# Patient Record
Sex: Female | Born: 1960 | Race: Black or African American | Hispanic: No | Marital: Married | Smoking: Never smoker
Health system: Southern US, Community
[De-identification: ages and names within clinical notes are randomized; demographics above are authoritative.]

## PROBLEM LIST (undated history)

## (undated) DIAGNOSIS — I739 Peripheral vascular disease, unspecified: Secondary | ICD-10-CM

## (undated) DIAGNOSIS — I2510 Atherosclerotic heart disease of native coronary artery without angina pectoris: Secondary | ICD-10-CM

## (undated) DIAGNOSIS — I5022 Chronic systolic (congestive) heart failure: Secondary | ICD-10-CM

## (undated) DIAGNOSIS — E785 Hyperlipidemia, unspecified: Secondary | ICD-10-CM

## (undated) DIAGNOSIS — J449 Chronic obstructive pulmonary disease, unspecified: Secondary | ICD-10-CM

## (undated) DIAGNOSIS — I255 Ischemic cardiomyopathy: Secondary | ICD-10-CM

## (undated) DIAGNOSIS — I480 Paroxysmal atrial fibrillation: Secondary | ICD-10-CM

## (undated) DIAGNOSIS — K219 Gastro-esophageal reflux disease without esophagitis: Secondary | ICD-10-CM

## (undated) HISTORY — PX: ABDOMINAL AORTA STENT: SHX1108

## (undated) HISTORY — DX: Hyperlipidemia, unspecified: E78.5

## (undated) HISTORY — DX: Paroxysmal atrial fibrillation: I48.0

## (undated) HISTORY — DX: Chronic obstructive pulmonary disease, unspecified: J44.9

## (undated) HISTORY — DX: Peripheral vascular disease, unspecified: I73.9

## (undated) HISTORY — DX: Ischemic cardiomyopathy: I25.5

## (undated) HISTORY — DX: Chronic systolic (congestive) heart failure: I50.22

---

## 2011-08-29 DIAGNOSIS — J449 Chronic obstructive pulmonary disease, unspecified: Secondary | ICD-10-CM | POA: Insufficient documentation

## 2012-10-12 DIAGNOSIS — I70219 Atherosclerosis of native arteries of extremities with intermittent claudication, unspecified extremity: Secondary | ICD-10-CM | POA: Insufficient documentation

## 2013-07-08 ENCOUNTER — Ambulatory Visit (HOSPITAL_COMMUNITY): Admit: 2013-07-08 | Payer: BC Managed Care – PPO | Admitting: Cardiology

## 2013-07-08 ENCOUNTER — Inpatient Hospital Stay (HOSPITAL_COMMUNITY): Payer: BC Managed Care – PPO

## 2013-07-08 ENCOUNTER — Encounter (HOSPITAL_COMMUNITY): Payer: Self-pay | Admitting: Anesthesiology

## 2013-07-08 ENCOUNTER — Inpatient Hospital Stay (HOSPITAL_COMMUNITY)
Admission: EM | Admit: 2013-07-08 | Discharge: 2013-07-13 | DRG: 550 | Disposition: A | Discharge status: Home / Self Care | Payer: BC Managed Care – PPO | Attending: Cardiology | Admitting: Cardiology

## 2013-07-08 ENCOUNTER — Encounter (HOSPITAL_COMMUNITY): Payer: Self-pay | Admitting: *Deleted

## 2013-07-08 ENCOUNTER — Encounter (HOSPITAL_COMMUNITY)
Admission: EM | Disposition: A | Discharge status: Home / Self Care | Payer: Self-pay | Source: Home / Self Care | Attending: Cardiology

## 2013-07-08 DIAGNOSIS — I2582 Chronic total occlusion of coronary artery: Secondary | ICD-10-CM | POA: Diagnosis present

## 2013-07-08 DIAGNOSIS — Z880 Allergy status to penicillin: Secondary | ICD-10-CM

## 2013-07-08 DIAGNOSIS — I509 Heart failure, unspecified: Secondary | ICD-10-CM | POA: Diagnosis present

## 2013-07-08 DIAGNOSIS — I5021 Acute systolic (congestive) heart failure: Secondary | ICD-10-CM | POA: Diagnosis present

## 2013-07-08 DIAGNOSIS — I4949 Other premature depolarization: Secondary | ICD-10-CM | POA: Diagnosis not present

## 2013-07-08 DIAGNOSIS — I252 Old myocardial infarction: Secondary | ICD-10-CM

## 2013-07-08 DIAGNOSIS — Z8249 Family history of ischemic heart disease and other diseases of the circulatory system: Secondary | ICD-10-CM

## 2013-07-08 DIAGNOSIS — I4891 Unspecified atrial fibrillation: Secondary | ICD-10-CM | POA: Diagnosis not present

## 2013-07-08 DIAGNOSIS — I739 Peripheral vascular disease, unspecified: Secondary | ICD-10-CM | POA: Diagnosis present

## 2013-07-08 DIAGNOSIS — Z7982 Long term (current) use of aspirin: Secondary | ICD-10-CM

## 2013-07-08 DIAGNOSIS — Z6835 Body mass index (BMI) 35.0-35.9, adult: Secondary | ICD-10-CM

## 2013-07-08 DIAGNOSIS — I2109 ST elevation (STEMI) myocardial infarction involving other coronary artery of anterior wall: Principal | ICD-10-CM | POA: Diagnosis present

## 2013-07-08 DIAGNOSIS — I2589 Other forms of chronic ischemic heart disease: Secondary | ICD-10-CM | POA: Diagnosis present

## 2013-07-08 DIAGNOSIS — I255 Ischemic cardiomyopathy: Secondary | ICD-10-CM | POA: Diagnosis present

## 2013-07-08 DIAGNOSIS — I2510 Atherosclerotic heart disease of native coronary artery without angina pectoris: Secondary | ICD-10-CM

## 2013-07-08 DIAGNOSIS — R739 Hyperglycemia, unspecified: Secondary | ICD-10-CM | POA: Diagnosis present

## 2013-07-08 DIAGNOSIS — E785 Hyperlipidemia, unspecified: Secondary | ICD-10-CM | POA: Diagnosis present

## 2013-07-08 DIAGNOSIS — R7309 Other abnormal glucose: Secondary | ICD-10-CM | POA: Diagnosis present

## 2013-07-08 DIAGNOSIS — I219 Acute myocardial infarction, unspecified: Secondary | ICD-10-CM

## 2013-07-08 DIAGNOSIS — Z7902 Long term (current) use of antithrombotics/antiplatelets: Secondary | ICD-10-CM

## 2013-07-08 DIAGNOSIS — K219 Gastro-esophageal reflux disease without esophagitis: Secondary | ICD-10-CM | POA: Diagnosis present

## 2013-07-08 DIAGNOSIS — I213 ST elevation (STEMI) myocardial infarction of unspecified site: Secondary | ICD-10-CM

## 2013-07-08 DIAGNOSIS — Z79899 Other long term (current) drug therapy: Secondary | ICD-10-CM

## 2013-07-08 DIAGNOSIS — Z888 Allergy status to other drugs, medicaments and biological substances status: Secondary | ICD-10-CM

## 2013-07-08 HISTORY — PX: LEFT HEART CATHETERIZATION WITH CORONARY ANGIOGRAM: SHX5451

## 2013-07-08 HISTORY — DX: Gastro-esophageal reflux disease without esophagitis: K21.9

## 2013-07-08 HISTORY — PX: PERCUTANEOUS STENT INTERVENTION: SHX5500

## 2013-07-08 HISTORY — DX: Atherosclerotic heart disease of native coronary artery without angina pectoris: I25.10

## 2013-07-08 LAB — BASIC METABOLIC PANEL
BUN: 9 mg/dL (ref 6–23)
CO2: 21 mEq/L (ref 19–32)
Calcium: 8.6 mg/dL (ref 8.4–10.5)
Calcium: 8.7 mg/dL (ref 8.4–10.5)
Creatinine, Ser: 0.45 mg/dL — ABNORMAL LOW (ref 0.50–1.10)
GFR calc Af Amer: >90 mL/min (ref >90)
GFR calc non Af Amer: >90 mL/min (ref >90)
Glucose, Bld: 133 mg/dL — ABNORMAL HIGH (ref 70–99)
Glucose, Bld: 120 mg/dL — ABNORMAL HIGH (ref 70–99)
Sodium: 134 mEq/L — ABNORMAL LOW (ref 135–145)
Sodium: 138 mEq/L (ref 135–145)

## 2013-07-08 LAB — TROPONIN I
Troponin I: <0.3 ng/mL (ref <0.30)
Troponin I: >20 ng/mL (ref <0.30)

## 2013-07-08 LAB — CBC
MCH: 29.8 pg (ref 26.0–34.0)
MCHC: 32.9 g/dL (ref 30.0–36.0)
Platelets: 223 10*3/uL (ref 150–400)
RBC: 4.46 MIL/uL (ref 3.87–5.11)

## 2013-07-08 LAB — COMPREHENSIVE METABOLIC PANEL
Albumin: 3.5 g/dL (ref 3.5–5.2)
BUN: 11 mg/dL (ref 6–23)
Calcium: 9.3 mg/dL (ref 8.4–10.5)
Creatinine, Ser: 0.56 mg/dL (ref 0.50–1.10)
GFR calc Af Amer: >90 mL/min (ref >90)
Glucose, Bld: 117 mg/dL — ABNORMAL HIGH (ref 70–99)
Potassium: 3.8 mEq/L (ref 3.5–5.1)
Total Protein: 7.3 g/dL (ref 6.0–8.3)

## 2013-07-08 LAB — PROTIME-INR
INR: 1 (ref 0.00–1.49)
Prothrombin Time: 13 seconds (ref 11.6–15.2)

## 2013-07-08 LAB — CBC WITH DIFFERENTIAL/PLATELET
Basophils Absolute: 0 10*3/uL (ref 0.0–0.1)
Eosinophils Relative: 1 % (ref 0–5)
Lymphs Abs: 3.9 10*3/uL (ref 0.7–4.0)
MCH: 29.8 pg (ref 26.0–34.0)
MCV: 91.3 fL (ref 78.0–100.0)
Monocytes Absolute: 0.9 10*3/uL (ref 0.1–1.0)
Neutrophils Relative %: 51 % (ref 43–77)
Platelets: 254 10*3/uL (ref 150–400)
RBC: 4.59 MIL/uL (ref 3.87–5.11)
RDW: 13.9 % (ref 11.5–15.5)
WBC: 9.9 10*3/uL (ref 4.0–10.5)

## 2013-07-08 LAB — POCT I-STAT, CHEM 8
BUN: 11 mg/dL (ref 6–23)
Creatinine, Ser: 0.6 mg/dL (ref 0.50–1.10)
Glucose, Bld: 120 mg/dL — ABNORMAL HIGH (ref 70–99)
Hemoglobin: 15 g/dL (ref 12.0–15.0)
Potassium: 3.6 mEq/L (ref 3.5–5.1)
Sodium: 141 mEq/L (ref 135–145)
TCO2: 24 mmol/L (ref 0–100)

## 2013-07-08 LAB — HEMOGLOBIN A1C
Hgb A1c MFr Bld: 6.1 % — ABNORMAL HIGH (ref <5.7)
Mean Plasma Glucose: 128 mg/dL — ABNORMAL HIGH (ref <117)

## 2013-07-08 LAB — LIPID PANEL
HDL: 48 mg/dL (ref >39)
LDL Cholesterol: 105 mg/dL — ABNORMAL HIGH (ref 0–99)

## 2013-07-08 SURGERY — LEFT HEART CATHETERIZATION WITH CORONARY ANGIOGRAM
Anesthesia: Choice | Laterality: Bilateral

## 2013-07-08 MED ORDER — METOPROLOL TARTRATE 1 MG/ML IV SOLN: 2.5000 mg | INTRAVENOUS | Status: DC | PRN

## 2013-07-08 MED ORDER — ONDANSETRON HCL 4 MG/2ML IJ SOLN
INTRAMUSCULAR | Status: AC
  Administered 2013-07-08: 4 mg
  Filled 2013-07-08: qty 2

## 2013-07-08 MED ORDER — SODIUM CHLORIDE 0.9 % IV SOLN
INTRAVENOUS | Status: DC
  Administered 2013-07-08: 02:00:00 via INTRAVENOUS

## 2013-07-08 MED ORDER — NITROGLYCERIN IN D5W 200-5 MCG/ML-% IV SOLN
2.0000 ug/min | INTRAVENOUS | Status: DC
  Administered 2013-07-08: 5 ug/min via INTRAVENOUS
  Filled 2013-07-08: qty 250

## 2013-07-08 MED ORDER — PANTOPRAZOLE SODIUM 40 MG IV SOLR
40.0000 mg | INTRAVENOUS | Status: DC
  Administered 2013-07-08: 40 mg via INTRAVENOUS
  Filled 2013-07-08 (×2): qty 40

## 2013-07-08 MED ORDER — ATORVASTATIN CALCIUM 80 MG PO TABS
80.0000 mg | ORAL_TABLET | Freq: Every day | ORAL | Status: DC
  Administered 2013-07-08 – 2013-07-12 (×5): 80 mg via ORAL
  Filled 2013-07-08 (×6): qty 1

## 2013-07-08 MED ORDER — PROMETHAZINE HCL 25 MG/ML IJ SOLN: 12.5000 mg | Freq: Three times a day (TID) | INTRAMUSCULAR | Status: DC | PRN

## 2013-07-08 MED ORDER — MORPHINE SULFATE 2 MG/ML IJ SOLN
2.0000 mg | INTRAMUSCULAR | Status: DC | PRN
  Administered 2013-07-08 – 2013-07-12 (×2): 2 mg via INTRAVENOUS
  Filled 2013-07-08 (×2): qty 1

## 2013-07-08 MED ORDER — HEPARIN SODIUM (PORCINE) 5000 UNIT/ML IJ SOLN
5000.0000 [IU] | Freq: Three times a day (TID) | INTRAMUSCULAR | Status: DC
  Filled 2013-07-08 (×3): qty 1

## 2013-07-08 MED ORDER — ASPIRIN 81 MG PO CHEW
81.0000 mg | CHEWABLE_TABLET | Freq: Every day | ORAL | Status: DC
  Administered 2013-07-08 – 2013-07-13 (×6): 81 mg via ORAL
  Filled 2013-07-08 (×6): qty 1

## 2013-07-08 MED ORDER — AMIODARONE HCL 200 MG PO TABS
400.0000 mg | ORAL_TABLET | Freq: Two times a day (BID) | ORAL | Status: DC
  Administered 2013-07-08 – 2013-07-09 (×4): 400 mg via ORAL
  Filled 2013-07-08 (×6): qty 2

## 2013-07-08 MED ORDER — METOPROLOL TARTRATE 1 MG/ML IV SOLN
INTRAVENOUS | Status: AC
  Administered 2013-07-08: 5 mg
  Filled 2013-07-08: qty 5

## 2013-07-08 MED ORDER — SODIUM CHLORIDE 0.9 % IV SOLN
1.0000 mL/kg/h | INTRAVENOUS | Status: AC
  Administered 2013-07-08 (×2): 1 mL/kg/h via INTRAVENOUS

## 2013-07-08 MED ORDER — ONDANSETRON HCL 4 MG/2ML IJ SOLN
4.0000 mg | Freq: Four times a day (QID) | INTRAMUSCULAR | Status: DC | PRN
  Administered 2013-07-08: 4 mg via INTRAVENOUS
  Filled 2013-07-08: qty 2

## 2013-07-08 MED ORDER — HEPARIN SOD (PORK) LOCK FLUSH 100 UNIT/ML IV SOLN
INTRAVENOUS | Status: AC
  Filled 2013-07-08: qty 5

## 2013-07-08 MED ORDER — CARVEDILOL 3.125 MG PO TABS
3.1250 mg | ORAL_TABLET | Freq: Two times a day (BID) | ORAL | Status: DC
  Administered 2013-07-08: 3.125 mg via ORAL
  Filled 2013-07-08 (×3): qty 1

## 2013-07-08 MED ORDER — PRASUGREL HCL 10 MG PO TABS
10.0000 mg | ORAL_TABLET | Freq: Every day | ORAL | Status: DC
  Administered 2013-07-08 – 2013-07-13 (×6): 10 mg via ORAL
  Filled 2013-07-08 (×6): qty 1

## 2013-07-08 MED ORDER — HYDRALAZINE HCL 20 MG/ML IJ SOLN
10.0000 mg | INTRAMUSCULAR | Status: DC | PRN
  Administered 2013-07-08: 10 mg via INTRAVENOUS
  Filled 2013-07-08: qty 1

## 2013-07-08 MED ORDER — ACETAMINOPHEN 325 MG PO TABS
650.0000 mg | ORAL_TABLET | ORAL | Status: DC | PRN
  Administered 2013-07-09 – 2013-07-10 (×3): 650 mg via ORAL
  Filled 2013-07-08 (×3): qty 2

## 2013-07-08 MED ORDER — HEPARIN (PORCINE) IN NACL 100-0.45 UNIT/ML-% IJ SOLN
1200.0000 [IU]/h | INTRAMUSCULAR | Status: DC
  Administered 2013-07-08: 900 [IU]/h via INTRAVENOUS
  Administered 2013-07-09 – 2013-07-11 (×3): 1050 [IU]/h via INTRAVENOUS
  Administered 2013-07-12: 1200 [IU]/h via INTRAVENOUS
  Filled 2013-07-08 (×9): qty 250

## 2013-07-08 MED ORDER — HEPARIN SODIUM (PORCINE) 5000 UNIT/ML IJ SOLN
4000.0000 [IU] | Freq: Once | INTRAMUSCULAR | Status: AC
  Administered 2013-07-08: 4000 [IU] via INTRAVENOUS
  Filled 2013-07-08: qty 0.8

## 2013-07-08 MED ORDER — MORPHINE SULFATE 4 MG/ML IJ SOLN
INTRAMUSCULAR | Status: AC
  Administered 2013-07-08: 4 mg
  Filled 2013-07-08: qty 1

## 2013-07-08 MED ORDER — CARVEDILOL 6.25 MG PO TABS
6.2500 mg | ORAL_TABLET | Freq: Two times a day (BID) | ORAL | Status: DC
  Administered 2013-07-08 – 2013-07-10 (×5): 6.25 mg via ORAL
  Filled 2013-07-08 (×12): qty 1

## 2013-07-08 MED ORDER — HEPARIN (PORCINE) IN NACL 100-0.45 UNIT/ML-% IJ SOLN
12.0000 [IU]/kg/h | INTRAMUSCULAR | Status: DC
Start: 2013-07-08 — End: 2013-07-08
  Administered 2013-07-08: 12 [IU]/kg/h via INTRAVENOUS
  Filled 2013-07-08: qty 250

## 2013-07-08 MED ORDER — METOPROLOL TARTRATE 1 MG/ML IV SOLN: 5.0000 mg | INTRAVENOUS | Status: DC | PRN

## 2013-07-08 MED ORDER — NITROGLYCERIN 0.4 MG SL SUBL
0.4000 mg | SUBLINGUAL_TABLET | SUBLINGUAL | Status: DC | PRN
  Administered 2013-07-08 (×3): 0.4 mg via SUBLINGUAL
  Filled 2013-07-08: qty 25

## 2013-07-08 MED ORDER — ASPIRIN 325 MG PO TABS
325.0000 mg | ORAL_TABLET | Freq: Once | ORAL | Status: AC
  Administered 2013-07-08: 325 mg via ORAL
  Filled 2013-07-08: qty 1

## 2013-07-08 MED ORDER — SODIUM CHLORIDE 0.9 % IV SOLN
0.2500 mg/kg/h | INTRAVENOUS | Status: AC
  Administered 2013-07-08: 0.25 mg/kg/h via INTRAVENOUS

## 2013-07-08 NOTE — Progress Notes (Signed)
*  PRELIMINARY RESULTS* Echocardiogram 2D Echocardiogram has been performed.  Traci Choi 07/08/2013, 10:58 AM

## 2013-07-08 NOTE — CV Procedure (Signed)
Cardiac Catheterization Procedure Note  Name: Traci Choi MRN: [id number] DOB: 14-Jan-1961  Procedure: Left Heart Cath, Selective Coronary Angiography, LV angiography, PTCA and stenting of the proximal LAD  Indication: 52 year-old female with previous history of aortic stenting presented with acute onset of chest pain tonight. Initial EKGs did not show diagnostic ST changes. Pain waxed and waned in intensity. With worsening chest pain repeat ECG demonstrated ST elevation in the anterior precordial leads consistent with an anterior STEMI. There was delay in diagnosis because of initial nondiagnostic ECG changes.  Procedural Details:  The right wrist was prepped, draped, and anesthetized with 1% lidocaine. Using the modified Seldinger technique, a 6 French sheath was introduced into the right radial artery. 3 mg of verapamil was administered through the sheath, weight-based unfractionated heparin was administered intravenously. Standard Judkins catheters were used for selective coronary angiography and left ventriculography. Catheter exchanges were performed over an exchange length guidewire.  PROCEDURAL FINDINGS Hemodynamics: AO 99/72 with a mean of 85 mmHg LV 101/19 mmHg   Coronary angiography: Coronary dominance: Left  Left mainstem: The left main coronary was short and without significant disease.  Left anterior descending (LAD): The left anterior descending artery was occluded proximally with TIMI grade 0 flow.  Left circumflex (LCx): The left circumflex was a dominant vessel. It gives rise to 2 marginal branches in the proximal and mid vessel. It then terminates in a small posterior lateral branch and the PDA. The left circumflex proper has no significant disease. The first obtuse marginal vessel as 40% narrowing proximally. The second marginal branch is relatively small with diffuse 70-80% disease proximally. There is also a segmental 70-80% stenosis in the proximal PDA.  Right  coronary artery (RCA): The right coronary is a small nondominant vessel and is normal.  Left ventriculography: Left ventricular size is normal. There is severe hypokinesis involving the mid to distal anterior wall. There is akinesis of the apex and distal inferior wall. Ejection fraction is estimated at 35-40%.  PCI Note:  Following the diagnostic procedure, the decision was made to proceed with PCI. The radial sheath was upsized to a 6 [place]. Weight-based bivalirudin was given for anticoagulation. Effient 60 mg was given orally.Once a therapeutic ACT was achieved, a 6 [place] XB LAD 3.5 guide catheter was inserted.  A pro-water coronary guidewire was used to cross the lesion.  The lesion was predilated with a 2.5 mm balloon. With reperfusion the patient did experience some PVCs and brief runs of idioventricular rhythm. The lesion was then stented with a 3.0 x 20 mm Promus premier stent.   Following PCI, there was 0% residual stenosis and TIMI-3 flow. Final angiography confirmed an excellent result. The patient was pain free at the end of the procedure. The patient tolerated the procedure well. There were no immediate procedural complications. A TR band was used for radial hemostasis. The patient was transferred to the post catheterization recovery area for further monitoring.  PCI Data: Vessel - LAD/Segment - proximal Percent Stenosis (pre)  100% TIMI-flow 0 Stent 3.0 x 20 mm Promus premier Percent Stenosis (post) 0% TIMI-flow (post) 3  Final Conclusions:   1. 2 vessel obstructive coronary disease. The culprit is the occlusion in the proximal LAD. 2. Moderate to severe left ventricular dysfunction 3. Successful stenting of the proximal LAD with a drug-eluting stent.   Recommendations:  We will continue IV Angiomax at a reduced dose for a few hours. Continue dual antiplatelet therapy for one year. Risk factor modification. Initiate beta  blocker and ACE inhibitor therapy as tolerated.  Theron Arista  [facility] 07/08/2013, 2:51 AM

## 2013-07-08 NOTE — ED Provider Notes (Signed)
CSN: [id number]     Arrival date & time 07/08/13  0111 History   None    Chief Complaint  Patient presents with  . Chest Pain    HPI Patient presents with waxing and waning chest discomfort that radiates up into her left jaw.  She's currently with her husband who is a patient in the hospital.  Her pain is worsened over the past hour.  She was having some "indigestion" symptoms through the latter part of this evening.  She developed chest discomfort when walking out to her car.  It resolved with rest.  She then came to the emergency department developed chest pain when walking back in to the emergency department.  Initially it sounds like the patient had a concerning ST segments on the monitor by nursing however by that time it will be was obtained the patient's pain was significantly improved and she had only nonspecific ST changes anteriorly.  No old EKG was available.  She denies a history of hypertension, hypercholesterol, diabetes.  She does not smoke cigarettes.  She states that her mother did have a heart attack in her late 4s.  She's never had a heart catheterization.  No known history of coronary disease.   Past Medical History  Diagnosis Date  . GERD (gastroesophageal reflux disease)   . Coronary artery disease    Past Surgical History  Procedure Laterality Date  . Abdominal aorta stent     No family history on file. History  Substance Use Topics  . Smoking status: Never Smoker   . Smokeless tobacco: Not on file  . Alcohol Use: No   OB History   Grav Para Term Preterm Abortions TAB SAB Ect Mult Living                 Review of Systems  All other systems reviewed and are negative.    Allergies  Flagyl and Penicillins  Home Medications  No current outpatient prescriptions on file. Ht 5' (1.524 m)  Wt 180 lb (81.647 kg)  BMI 35.15 kg/m2 Physical Exam  Nursing note and vitals reviewed. Constitutional: She is oriented to person, place, and time. She appears  well-developed and well-nourished. No distress.  HENT:  Head: Normocephalic and atraumatic.  Eyes: EOM are normal.  Neck: Normal range of motion.  Cardiovascular: Normal rate, regular rhythm and normal heart sounds.   Pulmonary/Chest: Effort normal and breath sounds normal.  Abdominal: Soft. She exhibits no distension. There is no tenderness.  Musculoskeletal: Normal range of motion.  Neurological: She is alert and oriented to person, place, and time.  Skin: Skin is warm and dry.  Psychiatric: She has a normal mood and affect. Judgment normal.    ED Course  Procedures (including critical care time)   Date: 07/08/2013  0114  Rate: 52  Rhythm: normal sinus rhythm  QRS Axis: normal  Intervals: normal  ST/T Wave abnormalities: Nonspecific anterior ST changes  Narrative Interpretation:   Old EKG Reviewed: No prior EKG available    Date: 07/08/2013  0131  Rate: 89  Rhythm: normal sinus rhythm  QRS Axis: normal  Intervals: normal  ST/T Wave abnormalities: Anterior ST elevation  Conduction Disutrbances: none  Narrative Interpretation:   Old EKG Reviewed: Change from prior EKG, now with anterior ST elevation consistent with STEMI  CRITICAL CARE Performed by: Lyanne Co Total critical care time: 30 Critical care time was exclusive of separately billable procedures and treating other patients. Critical care was necessary to treat or prevent  imminent or life-threatening deterioration. Critical care was time spent personally by me on the following activities: development of treatment plan with patient and/or surrogate as well as nursing, discussions with consultants, evaluation of patient's response to treatment, examination of patient, obtaining history from patient or surrogate, ordering and performing treatments and interventions, ordering and review of laboratory studies, ordering and review of radiographic studies, pulse oximetry and re-evaluation of patient's  condition.     Labs Review Labs Reviewed  CBC WITH DIFFERENTIAL  BASIC METABOLIC PANEL  TROPONIN I  APTT  CBC  COMPREHENSIVE METABOLIC PANEL  PROTIME-INR  POCT I-STAT TROPONIN I   Imaging Review No results found.  MDM   1. STEMI (ST elevation myocardial infarction)    Patient presented with initial equivocal EKG.  Serial EKGs were obtained which ultimately demonstrated anterior ST elevation.  Her pain is definitely waxing and waning at this time.  Some of this could represent coronary spasm versus ruptured plaque with intermittent obstruction.  Patient be transferred emergently to [facility] for definitive diagnosis and likely PCI.  I discussed the case with interventional cardiologist Dr. Peter Swaziland who accepts the patient in transfer.  The patient be transferred emergently.  She's been given aspirin 325 mg.  She's been given a heparin bolus of 4000 units without drip.  Currently her heart rate is 79.  No beta blocker will be given at this time.    Lyanne Co, MD 07/08/13 [phone]

## 2013-07-08 NOTE — Care Management Note (Addendum)
    Page 1 of 1   07/13/2013     5:27:54 PM   CARE MANAGEMENT NOTE 07/13/2013  Patient:  Traci Choi, Traci Choi   Account Number:  [id number]  Date Initiated:  07/08/2013  Documentation initiated by:  Junius Creamer  Subjective/Objective Assessment:   adm w mi     Action/Plan:   lives w husband   Anticipated DC Date:  07/13/2013   Anticipated DC Plan:  HOME/SELF CARE      DC Planning Services  CM consult  Medication Assistance      Choice offered to / List presented to:             Status of service:  Completed, signed off Medicare Important Message given?   (If response is "NO", the following Medicare IM given date fields will be blank) Date Medicare IM given:   Date Additional Medicare IM given:    Discharge Disposition:  HOME/SELF CARE  Per UR Regulation:  Reviewed for med. necessity/level of care/duration of stay  If discussed at Long Length of Stay Meetings, dates discussed:    Comments:  07/13/13 Javone Ybanez,RN,BSN [phone] PT ORIGINALLY THOUGHT TO HAVE NO INSURANCE; SHE ACTUALLY HAS BCBS INSURANCE.  SHE HAS UPDATED HER INFORMATION WITH ADMITTING.  EXPLAINED EFFIENT FREE 30DAY TRIAL CARD AND COPAY ASSISTANCE CARD TO PT.  NO NEED FOR DRUG CO. ASSISTANCE PROGRAM.  8/29 0922 debie dowell rn,bsn left pt effient 30day free card and copay assist cards. left 2 prescription discount cards. pt from [facility] no ins liested. left pt assist form on shadow chart for effient for md to sign.
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
## 2013-07-08 NOTE — ED Notes (Signed)
Notified EDP, Patria Mane, MD pt. I-stat troponin result 0.05.

## 2013-07-08 NOTE — Progress Notes (Signed)
Ward Givens NP notified of pt's B/P and HR. Orders received

## 2013-07-08 NOTE — Progress Notes (Signed)
Patient is now chest pain free. She feels breathless. Oxygen levels are good. Lungs clear on exam. Will check portable CXR.  Peter Sw[place]and MD, [facility]  5:20 PM

## 2013-07-08 NOTE — Progress Notes (Signed)
CRITICAL VALUE ALERT  Critical value received: Trop > 20  Date of notification:  06/2913  Time of notification:  N/A  Critical value read back:N/A  Nurse who received alert:  P. Mikle Bosworth RN  MD notified (1st page):  Expected value  Time of first page:  N/A  MD notified (2nd page):  Time of second page:  Responding MD:  N/A  Time MD responded:  N/A

## 2013-07-08 NOTE — H&P (Signed)
Cardiology History and Physical  No primary provider on file.  History of Present Illness (and review of medical records): Traci Choi is a 52 y.o. female who presents for evaluation of chest pain.  She has no prior hx of MI.  Denies HTN, DM, or tobacco history.  She presents with waxing and waning chest discomfort that radiates up into her left jaw. She's currently with her husband who is a patient in the hospital. Her pain is worsened over the past hour. She was having some "indigestion" symptoms through the latter part of this evening. She developed chest discomfort when walking out to her car. It resolved with rest. She then came to the emergency department developed chest pain when walking back in to the emergency department. She had ekg changes concerning during evaluation and Code Stemi was activated.  She was transferred to [facility] for further management.  Previous diagnostic testing for coronary artery disease includes: none. Previous history of cardiac disease includes Possible AAA repari. Coronary artery disease risk factors include: obesity (BMI >= 30 kg/m2). Patient denies history of previous M.I..  Review of Systems Further review of systems was otherwise negative other than stated in HPI.  There are no active problems to display for this patient.  Past Medical History  Diagnosis Date  . GERD (gastroesophageal reflux disease)   . Coronary artery disease     Past Surgical History  Procedure Laterality Date  . Abdominal aorta stent       (Not in a hospital admission) Allergies  Allergen Reactions  . Flagyl [Metronidazole]   . Penicillins     History  Substance Use Topics  . Smoking status: Never Smoker   . Smokeless tobacco: Not on file  . Alcohol Use: No    No family history on file.   Objective:  Patient Vitals for the past 8 hrs:  Height Weight  07/08/13 0137 5' (1.524 m) 81.647 kg (180 lb)   General appearance: alert, cooperative, appears stated age and  moderate distress, obese Head: Normocephalic, without obvious abnormality, atraumatic Eyes: conjunctivae/corneas clear. PERRL, EOM's intact. Fundi benign. Neck: no carotid bruit, no JVD and supple, symmetrical, trachea midline Lungs: clear to auscultation bilaterally Chest wall: no tenderness Heart: regular rate and rhythm, S1, S2 normal, no murmur, click, rub or gallop Abdomen: soft, non-tender; bowel sounds normal; no masses,  no organomegaly Extremities: extremities normal, atraumatic, no cyanosis or edema Pulses: 2+ and symmetric Neurologic: Grossly normal  Results for orders placed during the hospital encounter of 07/08/13 (from the past 48 hour(s))  CBC WITH DIFFERENTIAL     Status: None   Collection Time    07/08/13  1:28 AM      Result Value Range   WBC 9.9  4.0 - 10.5 K/uL   RBC 4.59  3.87 - 5.11 MIL/uL   Hemoglobin 13.7  12.0 - 15.0 g/dL   HCT 21.3  08.6 - 57.8 %   MCV 91.3  78.0 - 100.0 fL   MCH 29.8  26.0 - 34.0 pg   MCHC 32.7  30.0 - 36.0 g/dL   RDW 46.9  62.9 - 52.8 %   Platelets 254  150 - 400 K/uL   Neutrophils Relative % 51  43 - 77 %   Lymphocytes Relative 39  12 - 46 %   Monocytes Relative 9  3 - 12 %   Eosinophils Relative 1  0 - 5 %   Basophils Relative 0  0 - 1 %   Neutro  Abs 5.0  1.7 - 7.7 K/uL   Lymphs Abs 3.9  0.7 - 4.0 K/uL   Monocytes Absolute 0.9  0.1 - 1.0 K/uL   Eosinophils Absolute 0.1  0.0 - 0.7 K/uL   Basophils Absolute 0.0  0.0 - 0.1 K/uL   Smear Review MORPHOLOGY UNREMARKABLE    TROPONIN I     Status: None   Collection Time    07/08/13  1:28 AM      Result Value Range   Troponin I <0.30  <0.30 ng/mL   Comment:            Due to the release kinetics of cTnI,     a negative result within the first hours     of the onset of symptoms does not rule out     myocardial infarction with certainty.     If myocardial infarction is still suspected,     repeat the test at appropriate intervals.  COMPREHENSIVE METABOLIC PANEL     Status: Abnormal    Collection Time    07/08/13  1:28 AM      Result Value Range   Sodium 139  135 - 145 mEq/L   Potassium 3.8  3.5 - 5.1 mEq/L   Chloride 105  96 - 112 mEq/L   CO2 25  19 - 32 mEq/L   Glucose, Bld 117 (*) 70 - 99 mg/dL   BUN 11  6 - 23 mg/dL   Creatinine, Ser 9.60  0.50 - 1.10 mg/dL   Calcium 9.3  8.4 - 45.4 mg/dL   Total Protein 7.3  6.0 - 8.3 g/dL   Albumin 3.5  3.5 - 5.2 g/dL   AST 14  0 - 37 U/L   ALT 13  0 - 35 U/L   Alkaline Phosphatase 76  39 - 117 U/L   Total Bilirubin 0.2 (*) 0.3 - 1.2 mg/dL   GFR calc non Af Amer >90  >90 mL/min   GFR calc Af Amer >90  >90 mL/min   Comment: (NOTE)     The eGFR has been calculated using the CKD EPI equation.     This calculation has not been validated in all clinical situations.     eGFR's persistently <90 mL/min signify possible Chronic Kidney     Disease.  POCT I-STAT TROPONIN I     Status: None   Collection Time    07/08/13  1:37 AM      Result Value Range   Troponin i, poc 0.05  0.00 - 0.08 ng/mL   Comment 3            Comment: Due to the release kinetics of cTnI,     a negative result within the first hours     of the onset of symptoms does not rule out     myocardial infarction with certainty.     If myocardial infarction is still suspected,     repeat the test at appropriate intervals.  POCT I-STAT, CHEM 8     Status: Abnormal   Collection Time    07/08/13  1:50 AM      Result Value Range   Sodium 141  135 - 145 mEq/L   Potassium 3.6  3.5 - 5.1 mEq/L   Chloride 105  96 - 112 mEq/L   BUN 11  6 - 23 mg/dL   Creatinine, Ser 0.98  0.50 - 1.10 mg/dL   Glucose, Bld 119 (*) 70 - 99 mg/dL   Calcium,  Ion 1.22  1.12 - 1.23 mmol/L   TCO2 24  0 - 100 mmol/L   Hemoglobin 15.0  12.0 - 15.0 g/dL   HCT 16.1  09.6 - 04.5 %   No results found.  ECG:  Initial ecgs with 1mm ST elevation in anterior leads around 1:14am, this progressive to 2-83mm ST elevation around 1:43  Assessment: Acute anteroseptal STEMI  Plan:  1. Will  proceed with urgent cardiac catheterization with possible PCI by Dr. Swaziland 2. Cardiology Admission to ICU 3. Continuous monitoring on Telemetry. 4. Repeat ekg on admit, prn chest pain or arrythmia 5. Trend cardiac biomarkers, check lipids, hgba1c, tsh 6. Medical management to include DAPT, BB, Statin, NTG prn 7. Further plan pending clinical course and outcome of Cath 8. Cardiac education and rehab.

## 2013-07-08 NOTE — Progress Notes (Signed)
MD informed about new onset afiib with rates ranging from 80s to 110 and patient is hemodynamically stable. Orders given to give coreg early

## 2013-07-08 NOTE — Progress Notes (Deleted)
Hypoglycemic Event TIME 0406  CBG: 69  Treatment: 25ml d 50  Symptoms: sedated/unable to assess  Follow-up CBG: [id number] CBG Result:73  Possible Reasons for Event: npo  Comments/MD notified no    Amo Kuffour, Alinda Money  Remember to initiate Hypoglycemia Order Set & complete

## 2013-07-08 NOTE — Progress Notes (Signed)
Came to begin ed process with pt however she is too sleepy and feeling poorly to discuss. Left MI book and stent card with sister. Will f/u tomorrow. Pt does st she is having a nagging feeling in her chest. HR 128 Afib. Ethelda Chick CES, ACSM 2:13 PM 07/08/2013

## 2013-07-08 NOTE — Progress Notes (Signed)
Dr. Swaziland notified of pt;s rhythm atrial fib, and lab results,  DrMarland Kitchen Swaziland in to see pt. Orders received

## 2013-07-08 NOTE — Progress Notes (Signed)
ANTICOAGULATION CONSULT NOTE - Follow Up Consult  Pharmacy Consult for Heparin Indication: atrial fibrillation  Allergies  Allergen Reactions  . Flagyl [Metronidazole] Other (See Comments)    unknown  . Penicillins Nausea And Vomiting and Rash    Patient Measurements: Height: 5' (152.4 cm) Weight: 180 lb 5.4 oz (81.8 kg) IBW/kg (Calculated) : 45.5 Heparin Dosing Weight: 65 kg  Vital Signs: Temp: 98.6 F (37 C) (08/29 1943) Temp src: Oral (08/29 1943) BP: 109/73 mmHg (08/29 1900)  Labs:  Recent Labs  07/08/13 0128 07/08/13 0150 07/08/13 0430 07/08/13 1025 07/08/13 1600 07/08/13 2018  HGB 13.7 15.0 13.3  --   --   --   HCT 41.9 44.0 40.4  --   --   --   PLT 254  --  223  --   --   --   APTT 25  --   --   --   --   --   LABPROT 13.0  --   --   --   --   --   INR 1.00  --   --   --   --   --   HEPARINUNFRC  --   --   --   --   --  0.26*  CREATININE 0.56 0.60 0.43* 0.45*  --   --   TROPONINI <0.30  --   --  >20.00* >20.00*  --     Estimated Creatinine Clearance: 76.1 ml/min (by C-G formula based on Cr of 0.45).   Medications:  Infusions:  . heparin 900 Units/hr (07/08/13 1400)  . nitroGLYCERIN 5 mcg/min (07/08/13 1722)    Assessment: 52 year old female receiving anticoagulation with Heparin for atrial fibrillation.  Her initial heparin level is slightly subtherapeutic.  Goal of Therapy:  Heparin level 0.3-0.7 units/ml Monitor platelets by anticoagulation protocol: Yes   Plan:  Increase Heparin to 1050 units/hr Check Heparin level and CBC with AM labs  Estella Husk, Pharm.D., BCPS, AAHIVP Clinical Pharmacist Phone: [phone] or [phone] 07/08/2013, 9:21 PM

## 2013-07-08 NOTE — Progress Notes (Signed)
ANTICOAGULATION CONSULT NOTE - Initial Consult  Pharmacy Consult for heparin Indication:  afib  Allergies  Allergen Reactions  . Flagyl [Metronidazole] Other (See Comments)    unknown  . Penicillins Nausea And Vomiting and Rash    Patient Measurements: Height: 5' (152.4 cm) Weight: 180 lb 5.4 oz (81.8 kg) IBW/kg (Calculated) : 45.5 Heparin Dosing Weight: 64.5kg  Vital Signs: Temp: 97.5 F (36.4 C) (08/29 0722) Temp src: Oral (08/29 0722) BP: 125/98 mmHg (08/29 0900) Pulse Rate: 85 (08/29 0722)  Labs:  Recent Labs  07/08/13 0128 07/08/13 0150 07/08/13 0430  HGB 13.7 15.0 13.3  HCT 41.9 44.0 40.4  PLT 254  --  223  APTT 25  --   --   LABPROT 13.0  --   --   INR 1.00  --   --   CREATININE 0.56 0.60 0.43*  TROPONINI <0.30  --   --     Estimated Creatinine Clearance: 76.1 ml/min (by C-G formula based on Cr of 0.43).   Medical History: Past Medical History  Diagnosis Date  . GERD (gastroesophageal reflux disease)   . Coronary artery disease     Medications:  Scheduled:  . amiodarone  400 mg Oral BID  . aspirin  81 mg Oral Daily  . atorvastatin  80 mg Oral q1800  . carvedilol  3.125 mg Oral BID WC  . prasugrel  10 mg Oral Daily   Infusions:  . sodium chloride 1 mL/kg/hr (07/08/13 4098)    Assessment: 52 yo female here with CP and s/p cath noted with afib. Patient to begin heparin 6 hours after radial band removal (done at 7am today)  Goal of Therapy:  Heparin level 0.3-0.7 units/ml Monitor platelets by anticoagulation protocol: Yes   Plan:  -No heparin bolus -Begin heparin at 900 units/hr (~14 units/kg/hr) at 1pm -Heparin level in 6 hours and daily wth CBC daily  Harland German, Pharm D 07/08/2013 9:44 AM

## 2013-07-08 NOTE — Progress Notes (Signed)
TELEMETRY: Reviewed telemetry pt in atrial fibrillation with controlled ventricular response.: Filed Vitals:   07/08/13 0800 07/08/13 0900 07/08/13 1000 07/08/13 1100  BP: 106/76 125/98 133/95 151/127  Pulse:      Temp:      TempSrc:      Resp: 15 15 13 14   Height:      Weight:      SpO2: 100% 100% 100% 100%    Intake/Output Summary (Last 24 hours) at 07/08/13 1141 Last data filed at 07/08/13 1100  Gross per 24 hour  Intake 677.49 ml  Output    150 ml  Net 527.49 ml    SUBJECTIVE: Patient had some mild chest pressure at 5 am. Otherwise chest pain resolved. No SOB. Now in atrial fibrillation.  LABS: Basic Metabolic Panel:  Recent Labs  16/10/96 0430 07/08/13 1025  NA 134* 138  K 6.0* 4.4  CL 103 105  CO2 22 21  GLUCOSE 133* 120*  BUN 9 8  CREATININE 0.43* 0.45*  CALCIUM 8.6 8.7   Liver Function Tests:  Recent Labs  07/08/13 0128  AST 14  ALT 13  ALKPHOS 76  BILITOT 0.2*  PROT 7.3  ALBUMIN 3.5   No results found for this basename: LIPASE, AMYLASE,  in the last 72 hours CBC:  Recent Labs  07/08/13 0128 07/08/13 0150 07/08/13 0430  WBC 9.9  --  11.8*  NEUTROABS 5.0  --   --   HGB 13.7 15.0 13.3  HCT 41.9 44.0 40.4  MCV 91.3  --  90.6  PLT 254  --  223   Cardiac Enzymes:  Recent Labs  07/08/13 0128  TROPONINI <0.30   Fasting Lipid Panel:  Recent Labs  07/08/13 0430  CHOL 208*  HDL 48  LDLCALC 105*  TRIG 277*  CHOLHDL 4.3   Thyroid Function Tests:  Recent Labs  07/08/13 0430  TSH 1.545   Radiology/Studies:  No results found.  Ecg: atrial fibrillation with rate 94 bpm. Anteroseptal MI. ST elevation resolved.  PHYSICAL EXAM General: Well developed, well nourished, in no acute distress. Head: Normocephalic, atraumatic, sclera non-icteric, no xanthomas, nares are without discharge. Neck: Negative for carotid bruits. JVD not elevated. Lungs: Clear bilaterally to auscultation without wheezes, rales, or rhonchi. Breathing is  unlabored. Heart: IRRR S1 S2 without murmurs, rubs, or gallops.  Abdomen: Soft, non-tender, non-distended with normoactive bowel sounds. No hepatomegaly. No rebound/guarding. No obvious abdominal masses. Extremities: No clubbing, cyanosis or edema.  Distal pedal pulses are 2+ and equal bilaterally. No radial hematoma. Neuro: Alert and oriented X 3. Moves all extremities spontaneously. Psych:  Responds to questions appropriately with a normal affect.  ASSESSMENT AND PLAN: 1. Anterior STEMI. S/p DES of proximal LAD. Moderate residual disease in the OM2 and PDA that will be treated medically. On ASA and Effient. On coreg. Will add ACEi as BP allows. Check Echo today. Cycle cardiac enzymes. 2. Atrial fibrillation- new onset. Rate controlled. Will start amiodarone to see if we can convert. Will resume heparin IV 6 hours post radial band removal. Will wait and see if afib sustained before committing to long term anticoagulation. 3. Dyslipidemia. On high dose lipitor. 4. Hyperglycemia. Will check A1C. 5. S/p aortic stent-infrarenal ? For occlusive disease. 6. Ischemic cardiomyopathy EF 35-40%. Echo pending.   Principal Problem:   ST elevation myocardial infarction (STEMI) of anterior wall Active Problems:   Atrial fibrillation   Dyslipidemia   Hyperglycemia   PAD (peripheral artery disease)   Cardiomyopathy, ischemic  Signed, Peter Swaziland MD,FACC 07/08/2013 11:41 AM

## 2013-07-08 NOTE — Progress Notes (Addendum)
CTSP due to 4/10 chest pressure along with nausea/heaving. This pain reminds her of what her pain felt like "before it started" but not the CP she had while in the hospital. EKG shows continued afib 112bpm with ST elevation V2-V5. Reviewed with Dr. Swaziland who feels these EKG changes are evolution of this admission's MI. Pt received 2 SL NTG by nursing with gradual relief of pain. Blood pressure came down from earlier and is now on softer side - was elevated up to 151/127 earlier this afternoon for which she received IV hydralazine. Per discussion with MD, will rx IV Lopressor to help improve rate and make IV phenergan available. Increase PM Coreg dose to 6.25mg  BID and start IV NTG which will be titratable for BP.  Ronie Spies PA-C Patient seen and examined and history reviewed. Agree with above findings and plan. I saw and examined the patient. She complains of chest pressure rated as 4/4. Associated with SOB and nausea. Ecg shows no significant change from this am. BP was high but is responding to medication. Patient states pain is easing and is not nearly as bad as when she went to ED last night. Will continue to observe for now. Start IV Ntg for pain and BP.   Theron Arista [facility] 07/08/2013 3:20 PM

## 2013-07-08 NOTE — Progress Notes (Signed)
Ward Givens NP notified of Pt's C/O CP 4/10, EKG obtained and SL NTG given at 1445

## 2013-07-08 NOTE — Progress Notes (Signed)
Chaplain responded to a code stemi.  When chaplain arrived patient was in cath lab 6 being treated by cath lab team.  No family was present.  Provided spiritual care and support for cath lab team.  Cath lab team expressed appreciation for chaplain support.  Chaplain will refer case to chaplain resident assigned to CICU.   07/08/13 0300  Clinical Encounter Type  Visited With Patient;Health care provider  Visit Type Spiritual support;Code (code stemi)  Referral From Physician  Spiritual Encounters  Spiritual Needs Emotional  Stress Factors  Patient Stress Factors Health changes;Lack of caregivers;Major life changes  Family Stress Factors None identified;Not reviewed (No family present)  Rulon Abide

## 2013-07-09 LAB — CBC
HCT: 41.6 % (ref 36.0–46.0)
Hemoglobin: 13.7 g/dL (ref 12.0–15.0)
MCH: 30.3 pg (ref 26.0–34.0)
MCV: 92 fL (ref 78.0–100.0)
Platelets: 250 10*3/uL (ref 150–400)
RBC: 4.52 MIL/uL (ref 3.87–5.11)

## 2013-07-09 LAB — BASIC METABOLIC PANEL
BUN: 10 mg/dL (ref 6–23)
GFR calc non Af Amer: >90 mL/min (ref >90)
Glucose, Bld: 121 mg/dL — ABNORMAL HIGH (ref 70–99)
Potassium: 4.5 mEq/L (ref 3.5–5.1)

## 2013-07-09 MED ORDER — SPIRONOLACTONE 12.5 MG HALF TABLET: 12.5000 mg | ORAL_TABLET | Freq: Every day | ORAL | Status: DC

## 2013-07-09 MED ORDER — PANTOPRAZOLE SODIUM 40 MG PO TBEC
40.0000 mg | DELAYED_RELEASE_TABLET | Freq: Every day | ORAL | Status: DC
  Administered 2013-07-09 – 2013-07-13 (×5): 40 mg via ORAL
  Filled 2013-07-09 (×5): qty 1

## 2013-07-09 MED ORDER — SODIUM CHLORIDE 0.9 % IV SOLN
INTRAVENOUS | Status: DC
  Administered 2013-07-10: 20 mL/h via INTRAVENOUS
  Administered 2013-07-12: 16:00:00 via INTRAVENOUS

## 2013-07-09 MED ORDER — SPIRONOLACTONE 12.5 MG HALF TABLET
12.5000 mg | ORAL_TABLET | Freq: Every day | ORAL | Status: DC
  Administered 2013-07-09 – 2013-07-13 (×5): 12.5 mg via ORAL
  Filled 2013-07-09 (×5): qty 1

## 2013-07-09 NOTE — Progress Notes (Addendum)
Pt in recliner upon arrival.  Completed education and NTG use.  Pt stated she had no questions.  Interested in CRP II in [place].  Alexia Freestone, MS, ACSM RCEP 4:07 PM

## 2013-07-09 NOTE — Progress Notes (Signed)
CARDIAC REHAB PHASE I   PRE:  Rate/Rhythm: 77 sinus rhythm  BP:  Supine:   Sitting: 100/72  Standing:    SaO2: 100%ra  MODE:  Ambulation: 200 ft   POST:  Rate/Rhythem: 84 sinus rhythm  BP:  Supine:   Sitting: 97/79  Standing:    SaO2: 100%ra 1430-1500 Pt ambulated in hallway x2 assist with rolling walker. Pt asymptomatic, slow steady gait. Pt returned to room back to chair call light in reach.  Family at bedside.  Pt visiting with family.  Will return to complete education.    Traci Choi, [place]

## 2013-07-09 NOTE — Progress Notes (Signed)
ANTICOAGULATION CONSULT NOTE - Follow Up Consult  Pharmacy Consult for Heparin Indication: afib  Allergies  Allergen Reactions  . Flagyl [Metronidazole] Other (See Comments)    unknown  . Penicillins Nausea And Vomiting and Rash    Patient Measurements: Height: 5' (152.4 cm) Weight: 180 lb 5.4 oz (81.8 kg) IBW/kg (Calculated) : 45.5 Heparin Dosing Weight: 65 kg  Vital Signs: Temp: 98.6 F (37 C) (08/30 0800) Temp src: Oral (08/30 0800) BP: 96/60 mmHg (08/30 0900) Pulse Rate: 72 (08/30 0800)  Labs:  Recent Labs  07/08/13 0128 07/08/13 0150 07/08/13 0430 07/08/13 1025 07/08/13 1600 07/08/13 2018 07/09/13 0630  HGB 13.7 15.0 13.3  --   --   --  13.7  HCT 41.9 44.0 40.4  --   --   --  41.6  PLT 254  --  223  --   --   --  250  APTT 25  --   --   --   --   --   --   LABPROT 13.0  --   --   --   --   --   --   INR 1.00  --   --   --   --   --   --   HEPARINUNFRC  --   --   --   --   --  0.26* 0.46  CREATININE 0.56 0.60 0.43* 0.45*  --   --  0.66  TROPONINI <0.30  --   --  >20.00* >20.00*  --   --     Estimated Creatinine Clearance: 76.1 ml/min (by C-G formula based on Cr of 0.66).   Assessment: STEMI with DES to LAD + afib  Anticoagulation: Afib. Heparin level 0.46. CBC ok. Outpatient anticoagulation TBA.  Cardiovascular: s/p STEMI, ICM with EF 35-40%, Afib. BP 96/60, HR 72 Meds: Amiodarone po, ASA 81mg , Lipitor, Coreg, Effient, spironolaction (K=4.5)  PTA Medication Issues: Nexium not resumed   Goal of Therapy:  Heparin level 0.3-0.7 units/ml Monitor platelets by anticoagulation protocol: Yes   Plan:  Continue heparin at 1050 units/hr   Ximenna Fonseca S. Merilynn Finland, PharmD, BCPS Clinical Staff Pharmacist Pager [phone]  Misty Stanley Stillinger 07/09/2013,9:23 AM

## 2013-07-09 NOTE — Progress Notes (Signed)
Patient ID: Traci Choi, female   DOB: [birth year], 52 y.o.   MRN: [id number]    SUBJECTIVE: No further chest pain, no dyspnea.  Feels good.  BP still a bit soft today. Off NTG gtt.   Marland Kitchen amiodarone  400 mg Oral BID  . aspirin  81 mg Oral Daily  . atorvastatin  80 mg Oral q1800  . carvedilol  6.25 mg Oral BID WC  . pantoprazole (PROTONIX) IV  40 mg Intravenous Q24H  . prasugrel  10 mg Oral Daily  . spironolactone  12.5 mg Oral Daily  heparin gtt    Filed Vitals:   07/09/13 0300 07/09/13 0400 07/09/13 0500 07/09/13 0600  BP: 99/66 109/60 100/72 86/62  Pulse:      Temp:   98.5 F (36.9 C)   TempSrc:   Oral   Resp: 14 13 15 19   Height:      Weight:      SpO2: 100% 100% 100% 100%    Intake/Output Summary (Last 24 hours) at 07/09/13 0849 Last data filed at 07/09/13 0600  Gross per 24 hour  Intake 1066.93 ml  Output    600 ml  Net 466.93 ml    LABS: Basic Metabolic Panel:  Recent Labs  78/29/56 1025 07/09/13 0630  NA 138 140  K 4.4 4.5  CL 105 103  CO2 21 25  GLUCOSE 120* 121*  BUN 8 10  CREATININE 0.45* 0.66  CALCIUM 8.7 9.2   Liver Function Tests:  Recent Labs  07/08/13 0128  AST 14  ALT 13  ALKPHOS 76  BILITOT 0.2*  PROT 7.3  ALBUMIN 3.5   No results found for this basename: LIPASE, AMYLASE,  in the last 72 hours CBC:  Recent Labs  07/08/13 0128  07/08/13 0430 07/09/13 0630  WBC 9.9  --  11.8* 10.8*  NEUTROABS 5.0  --   --   --   HGB 13.7  < > 13.3 13.7  HCT 41.9  < > 40.4 41.6  MCV 91.3  --  90.6 92.0  PLT 254  --  223 250  < > = values in this interval not displayed. Cardiac Enzymes:  Recent Labs  07/08/13 0128 07/08/13 1025 07/08/13 1600  TROPONINI <0.30 >20.00* >20.00*   BNP: No components found with this basename: POCBNP,  D-Dimer: No results found for this basename: DDIMER,  in the last 72 hours Hemoglobin A1C:  Recent Labs  07/08/13 1025  HGBA1C 6.1*   Fasting Lipid Panel:  Recent Labs  07/08/13 0430  CHOL 208*    HDL 48  LDLCALC 105*  TRIG 277*  CHOLHDL 4.3   Thyroid Function Tests:  Recent Labs  07/08/13 1025  TSH 1.451   Anemia Panel: No results found for this basename: VITAMINB12, FOLATE, FERRITIN, TIBC, IRON, RETICCTPCT,  in the last 72 hours  RADIOLOGY: Dg Chest Port 1 View  07/08/2013   *RADIOLOGY REPORT*  Clinical Data: ST elevation myocardial infarction.  PORTABLE CHEST - 1 VIEW  Comparison: No priors.  Findings: Unusual linear opacity projecting to the right of the right hilum either within the right lower lobe or right middle lobe.  This may represent an area of subsegmental atelectasis or scarring, but is of uncertain etiology and significance.  Lungs are otherwise clear.  No pleural effusions.  No evidence of pulmonary edema.  Heart size appears borderline to mildly enlarged. Mediastinal contours are unremarkable.  IMPRESSION: 1.  No radiographic evidence of acute cardiopulmonary disease. 2.  Borderline cardiac  enlargement. 3.  Linear opacity in the right mid to lower lung, as above, favored to represent subsegmental atelectasis or scarring. Attention on follow-up studies is recommended.   Original Report Authenticated By: Trudie Reed, M.D.    PHYSICAL EXAM General: NAD Neck: No JVD, no thyromegaly or thyroid nodule.  Lungs: Slight crackles at bases. CV: Nondisplaced PMI.  Heart regular S1/S2, no S3/S4, no murmur.  No peripheral edema.  No carotid bruit.  Normal pedal pulses.  Abdomen: Soft, nontender, no hepatosplenomegaly, no distention.  Neurologic: Alert and oriented x 3.  Psych: Normal affect. Extremities: No clubbing or cyanosis.   TELEMETRY: Reviewed telemetry pt in NSR  ASSESSMENT AND PLAN: 52 yo presented with anterior STEMI, now s/p DES to LAD, also with moderate LV systolic dysfunction and paroxysmal atrial fibrillation.  1. CAD: s/p anterior STEMI, DES to LAD.  Continue ASA 81/Effient, high dose atorvastatin.  Can get out of bed with cardiac rehab today.   2.  Ischemic cardiomyopathy: EF 35-40% by echo post-anterior MI.  Continue Coreg.  Hold off on ACEI with soft blood pressure (will try to add tomorrow).  Will start low dose spironolactone 12.5 mg daily.  She does not appear volume overloaded today and is not short of breath at rest.  Will need echo in 3 mos to reassess EF.  3. Atrial fibrillation: Patient was in atrial fibrillation yesterday, back in NSR now.  She is currently on heparin gtt.  Will discuss future anticoagulation plan with Dr Swaziland who will be following her.  Would consider Effient + apixaban at discharge.  Will continue Effient/ASA 81/heparin gtt for now pending his evaluation.   Will put on step down today.   Traci Choi 07/09/2013

## 2013-07-10 LAB — BASIC METABOLIC PANEL
BUN: 10 mg/dL (ref 6–23)
CO2: 21 mEq/L (ref 19–32)
Calcium: 8.4 mg/dL (ref 8.4–10.5)
Creatinine, Ser: 0.63 mg/dL (ref 0.50–1.10)
Glucose, Bld: 113 mg/dL — ABNORMAL HIGH (ref 70–99)

## 2013-07-10 LAB — CBC
HCT: 37.9 % (ref 36.0–46.0)
MCH: 29.5 pg (ref 26.0–34.0)
MCHC: 32.7 g/dL (ref 30.0–36.0)
MCV: 90 fL (ref 78.0–100.0)
RDW: 14.1 % (ref 11.5–15.5)

## 2013-07-10 MED ORDER — DOCUSATE SODIUM 100 MG PO CAPS
100.0000 mg | ORAL_CAPSULE | Freq: Two times a day (BID) | ORAL | Status: DC
  Administered 2013-07-10 – 2013-07-13 (×7): 100 mg via ORAL
  Filled 2013-07-10 (×9): qty 1

## 2013-07-10 MED ORDER — AMIODARONE HCL 200 MG PO TABS
200.0000 mg | ORAL_TABLET | Freq: Two times a day (BID) | ORAL | Status: DC
  Administered 2013-07-10 – 2013-07-13 (×7): 200 mg via ORAL
  Filled 2013-07-10 (×8): qty 1

## 2013-07-10 NOTE — Progress Notes (Signed)
Report given to [facility] on 2W and patient will be transferred to room 2W28.

## 2013-07-10 NOTE — Progress Notes (Addendum)
ANTICOAGULATION CONSULT NOTE - Follow Up Consult  Pharmacy Consult for Heparin Indication: afib  Allergies  Allergen Reactions  . Flagyl [Metronidazole] Other (See Comments)    unknown  . Penicillins Nausea And Vomiting and Rash    Patient Measurements: Height: 5' (152.4 cm) Weight: 180 lb 5.4 oz (81.8 kg) IBW/kg (Calculated) : 45.5 Heparin Dosing Weight: 65 kg  Vital Signs: Temp: 98.9 F (37.2 C) (08/31 0800) Temp src: Oral (08/31 0800) BP: 99/49 mmHg (08/31 0800) Pulse Rate: 83 (08/31 0800)  Labs:  Recent Labs  07/08/13 0128  07/08/13 0430 07/08/13 1025 07/08/13 1600 07/08/13 2018 07/09/13 0630 07/10/13 0500  HGB 13.7  < > 13.3  --   --   --  13.7 12.4  HCT 41.9  < > 40.4  --   --   --  41.6 37.9  PLT 254  --  223  --   --   --  250 210  APTT 25  --   --   --   --   --   --   --   LABPROT 13.0  --   --   --   --   --   --   --   INR 1.00  --   --   --   --   --   --   --   HEPARINUNFRC  --   --   --   --   --  0.26* 0.46 0.55  CREATININE 0.56  < > 0.43* 0.45*  --   --  0.66 0.63  TROPONINI <0.30  --   --  >20.00* >20.00*  --   --   --   < > = values in this interval not displayed.  Estimated Creatinine Clearance: 76.1 ml/min (by C-G formula based on Cr of 0.63).   Assessment: STEMI with DES to LAD + afib  Anticoagulation: Afib. Heparin level 0.55. Watch CBC. Discharge anticoagulation TBD.  Cardiovascular: s/p STEMI, ICM with EF 35-40%, Afib. BP 99/49, HR 83 Meds: Amiodarone po, ASA 81mg , Lipitor, Coreg, Effient, spironolactone (K=3.6)   Goal of Therapy:  Heparin level 0.3-0.7 units/ml Monitor platelets by anticoagulation protocol: Yes   Plan:  Continue heparin at 1050 units/hr   Karmella Bouvier S. Merilynn Finland, PharmD, BCPS Clinical Staff Pharmacist Pager [phone]  Misty Stanley Stillinger 07/10/2013,9:07 AM

## 2013-07-10 NOTE — Progress Notes (Signed)
Patient ID: Traci Choi, female   DOB: 1961-06-26, 52 y.o.   MRN: [id number]   SUBJECTIVE: No further chest pain, no dyspnea.  Some abdominal cramping, especially last night.  Has not had a bowel movement in a number of days.  BP still on the low side but denies lightheadedness.   Marland Kitchen amiodarone  200 mg Oral BID  . aspirin  81 mg Oral Daily  . atorvastatin  80 mg Oral q1800  . carvedilol  6.25 mg Oral BID WC  . docusate sodium  100 mg Oral BID  . pantoprazole  40 mg Oral Daily  . prasugrel  10 mg Oral Daily  . spironolactone  12.5 mg Oral Daily  heparin gtt    Filed Vitals:   07/09/13 2003 07/10/13 0000 07/10/13 0400 07/10/13 0800  BP: 91/58 90/57 100/48 99/49  Pulse:    83  Temp: 98.1 F (36.7 C) 98.3 F (36.8 C) 99.9 F (37.7 C)   TempSrc: Oral Oral Oral   Resp: 22 19 21 22   Height:      Weight:      SpO2: 100% 100% 99% 99%    Intake/Output Summary (Last 24 hours) at 07/10/13 0808 Last data filed at 07/10/13 0800  Gross per 24 hour  Intake   1552 ml  Output    151 ml  Net   1401 ml    LABS: Basic Metabolic Panel:  Recent Labs  40/98/11 0630 07/10/13 0500  NA 140 134*  K 4.5 3.6  CL 103 102  CO2 25 21  GLUCOSE 121* 113*  BUN 10 10  CREATININE 0.66 0.63  CALCIUM 9.2 8.4   Liver Function Tests:  Recent Labs  07/08/13 0128  AST 14  ALT 13  ALKPHOS 76  BILITOT 0.2*  PROT 7.3  ALBUMIN 3.5   No results found for this basename: LIPASE, AMYLASE,  in the last 72 hours CBC:  Recent Labs  07/08/13 0128  07/09/13 0630 07/10/13 0500  WBC 9.9  < > 10.8* 13.3*  NEUTROABS 5.0  --   --   --   HGB 13.7  < > 13.7 12.4  HCT 41.9  < > 41.6 37.9  MCV 91.3  < > 92.0 90.0  PLT 254  < > 250 210  < > = values in this interval not displayed. Cardiac Enzymes:  Recent Labs  07/08/13 0128 07/08/13 1025 07/08/13 1600  TROPONINI <0.30 >20.00* >20.00*   BNP: No components found with this basename: POCBNP,  D-Dimer: No results found for this basename:  DDIMER,  in the last 72 hours Hemoglobin A1C:  Recent Labs  07/08/13 1025  HGBA1C 6.1*   Fasting Lipid Panel:  Recent Labs  07/08/13 0430  CHOL 208*  HDL 48  LDLCALC 105*  TRIG 277*  CHOLHDL 4.3   Thyroid Function Tests:  Recent Labs  07/08/13 1025  TSH 1.451   Anemia Panel: No results found for this basename: VITAMINB12, FOLATE, FERRITIN, TIBC, IRON, RETICCTPCT,  in the last 72 hours  RADIOLOGY: Dg Chest Port 1 View  07/08/2013   *RADIOLOGY REPORT*  Clinical Data: ST elevation myocardial infarction.  PORTABLE CHEST - 1 VIEW  Comparison: No priors.  Findings: Unusual linear opacity projecting to the right of the right hilum either within the right lower lobe or right middle lobe.  This may represent an area of subsegmental atelectasis or scarring, but is of uncertain etiology and significance.  Lungs are otherwise clear.  No pleural effusions.  No  evidence of pulmonary edema.  Heart size appears borderline to mildly enlarged. Mediastinal contours are unremarkable.  IMPRESSION: 1.  No radiographic evidence of acute cardiopulmonary disease. 2.  Borderline cardiac enlargement. 3.  Linear opacity in the right mid to lower lung, as above, favored to represent subsegmental atelectasis or scarring. Attention on follow-up studies is recommended.   Original Report Authenticated By: Trudie Reed, M.D.    PHYSICAL EXAM General: NAD Neck: No JVD, no thyromegaly or thyroid nodule.  Lungs: Slight crackles at bases. CV: Nondisplaced PMI.  Heart regular S1/S2, no S3/S4, no murmur.  No peripheral edema.  No carotid bruit.  Normal pedal pulses.  Abdomen: Soft, nontender, no hepatosplenomegaly, no distention.  Neurologic: Alert and oriented x 3.  Psych: Normal affect. Extremities: No clubbing or cyanosis.   TELEMETRY: Reviewed telemetry pt in NSR  ASSESSMENT AND PLAN: 52 yo presented with anterior STEMI, now s/p DES to LAD, also with moderate LV systolic dysfunction and paroxysmal  atrial fibrillation.  1. CAD: s/p anterior STEMI, DES to LAD.  Continue ASA 81/Effient, high dose atorvastatin.  Continue cardiac rehab. 2. Ischemic cardiomyopathy: EF 35-40% by echo post-anterior MI.  Continue Coreg and spironolactone.  Hold off on ACEI with soft blood pressure (can try to add tomorrow).  She does not appear volume overloaded today and is not short of breath at rest.  Will need echo in 3 mos to reassess EF.  3. Atrial fibrillation: Patient has stayed in NSR since Friday.  She is currently on heparin gtt.  Will discuss future anticoagulation plan with Dr Swaziland who will be following her.  Would consider Effient + apixaban at discharge.  Will continue Effient/ASA 81/heparin gtt for now pending his evaluation.  Can decrease amiodarone to 200 mg bid today.  Would probably continue amiodarone for about a month post-hospitalization and then stop.   Can go to floor today, probably home tomorrow.    [name] 07/10/2013

## 2013-07-10 NOTE — Progress Notes (Signed)
Called unit 2W to give report and nurse is not available. Provided my number for her to call back. Will continue to monitor.

## 2013-07-11 DIAGNOSIS — I4891 Unspecified atrial fibrillation: Secondary | ICD-10-CM

## 2013-07-11 DIAGNOSIS — I2589 Other forms of chronic ischemic heart disease: Secondary | ICD-10-CM

## 2013-07-11 DIAGNOSIS — E785 Hyperlipidemia, unspecified: Secondary | ICD-10-CM

## 2013-07-11 LAB — CBC
Hemoglobin: 11.5 g/dL — ABNORMAL LOW (ref 12.0–15.0)
MCH: 30.7 pg (ref 26.0–34.0)
MCV: 89.3 fL (ref 78.0–100.0)
RBC: 3.74 MIL/uL — ABNORMAL LOW (ref 3.87–5.11)
WBC: 11.4 10*3/uL — ABNORMAL HIGH (ref 4.0–10.5)

## 2013-07-11 LAB — BASIC METABOLIC PANEL
BUN: 9 mg/dL (ref 6–23)
CO2: 22 mEq/L (ref 19–32)
Chloride: 103 mEq/L (ref 96–112)
Glucose, Bld: 107 mg/dL — ABNORMAL HIGH (ref 70–99)
Potassium: 3.3 mEq/L — ABNORMAL LOW (ref 3.5–5.1)

## 2013-07-11 MED ORDER — TRAMADOL HCL 50 MG PO TABS
50.0000 mg | ORAL_TABLET | Freq: Four times a day (QID) | ORAL | Status: DC | PRN
  Administered 2013-07-11 – 2013-07-12 (×5): 50 mg via ORAL
  Filled 2013-07-11 (×5): qty 1

## 2013-07-11 NOTE — Progress Notes (Signed)
ANTICOAGULATION CONSULT NOTE - Follow Up Consult  Pharmacy Consult for Heparin Indication: afib  Allergies  Allergen Reactions  . Flagyl [Metronidazole] Other (See Comments)    unknown  . Penicillins Nausea And Vomiting and Rash    Patient Measurements: Height: 5' (152.4 cm) Weight: 180 lb 5.4 oz (81.8 kg) IBW/kg (Calculated) : 45.5 Heparin Dosing Weight: 65 kg  Vital Signs: Temp: 98.4 F (36.9 C) (09/01 0508) Temp src: Oral (09/01 0508) BP: 96/64 mmHg (09/01 0909) Pulse Rate: 79 (09/01 0909)  Labs:  Recent Labs  07/08/13 1025 07/08/13 1600  07/09/13 0630 07/10/13 0500 07/11/13 0440  HGB  --   --   < > 13.7 12.4 11.5*  HCT  --   --   --  41.6 37.9 33.4*  PLT  --   --   --  250 210 180  HEPARINUNFRC  --   --   < > 0.46 0.55 0.36  CREATININE 0.45*  --   --  0.66 0.63 0.61  TROPONINI >20.00* >20.00*  --   --   --   --   < > = values in this interval not displayed.  Estimated Creatinine Clearance: 76.1 ml/min (by C-G formula based on Cr of 0.61).   3 YOF s/p STEMI with DES to LAD + afib. Per Dr. Elvis Coil note today, to continue heparin for AFib which occurred in setting of acute STEMI. Need to continue DAPT, can consider long term anticoag if AFib reoccurs. Heparin level this morning remains therapeutic at 0.36. H/H did drop slightly, plts remain WNL. No bleeding noted.  Goal of Therapy:  Heparin level 0.3-0.7 units/ml Monitor platelets by anticoagulation protocol: Yes   Plan:  1. Continue heparin at 1050 units/hr 2. Daily HL and CBC 3. Follow up any long term plans 4. Follow for any bleeding or bruising  Renel Ende D. Marvelous Woolford, PharmD Clinical Pharmacist Pager: [phone] 07/11/2013 9:23 AM

## 2013-07-11 NOTE — Progress Notes (Signed)
Patient ID: Traci Choi, female   DOB: [birth year], 52 y.o.   MRN: [id number]   SUBJECTIVE: Complains of chest pain beginning at 5 am. Describes this as a constant pain beneath left breast. No relation to breathing or movement. Not at all like pain from MI. No SOB. Looks very comfortable.   Marland Kitchen amiodarone  200 mg Oral BID  . aspirin  81 mg Oral Daily  . atorvastatin  80 mg Oral q1800  . carvedilol  6.25 mg Oral BID WC  . docusate sodium  100 mg Oral BID  . pantoprazole  40 mg Oral Daily  . prasugrel  10 mg Oral Daily  . spironolactone  12.5 mg Oral Daily  heparin gtt    Filed Vitals:   07/10/13 2100 07/10/13 2300 07/11/13 0508 07/11/13 0800  BP: 104/63 96/60 96/66  87/54  Pulse: 84 92 76 85  Temp: 98.8 F (37.1 C)  98.4 F (36.9 C)   TempSrc: Oral  Oral   Resp:   20   Height:      Weight:      SpO2: 99%  100%     Intake/Output Summary (Last 24 hours) at 07/11/13 0837 Last data filed at 07/11/13 0600  Gross per 24 hour  Intake   1361 ml  Output      0 ml  Net   1361 ml    LABS: Basic Metabolic Panel:  Recent Labs  65/78/46 0500 07/11/13 0440  NA 134* 135  K 3.6 3.3*  CL 102 103  CO2 21 22  GLUCOSE 113* 107*  BUN 10 9  CREATININE 0.63 0.61  CALCIUM 8.4 8.4   CBC:  Recent Labs  07/10/13 0500 07/11/13 0440  WBC 13.3* 11.4*  HGB 12.4 11.5*  HCT 37.9 33.4*  MCV 90.0 89.3  PLT 210 180   Cardiac Enzymes:  Recent Labs  07/08/13 1025 07/08/13 1600  TROPONINI >20.00* >20.00*   Hemoglobin A1C:  Recent Labs  07/08/13 1025  HGBA1C 6.1*   Thyroid Function Tests:  Recent Labs  07/08/13 1025  TSH 1.451    RADIOLOGY: Dg Chest Port 1 View  07/08/2013   *RADIOLOGY REPORT*  Clinical Data: ST elevation myocardial infarction.  PORTABLE CHEST - 1 VIEW  Comparison: No priors.  Findings: Unusual linear opacity projecting to the right of the right hilum either within the right lower lobe or right middle lobe.  This may represent an area of subsegmental  atelectasis or scarring, but is of uncertain etiology and significance.  Lungs are otherwise clear.  No pleural effusions.  No evidence of pulmonary edema.  Heart size appears borderline to mildly enlarged. Mediastinal contours are unremarkable.  IMPRESSION: 1.  No radiographic evidence of acute cardiopulmonary disease. 2.  Borderline cardiac enlargement. 3.  Linear opacity in the right mid to lower lung, as above, favored to represent subsegmental atelectasis or scarring. Attention on follow-up studies is recommended.   Original Report Authenticated By: Trudie Reed, M.D.   Ecg: NSR. Septal infarct-recent. Mild persistent ST elevation V2-3. T wave inversion resolved.   PHYSICAL EXAM General: NAD Neck: No JVD, no thyromegaly or thyroid nodule.  Lungs: Clear CV: Nondisplaced PMI.  Heart regular S1/S2, no S3/S4, no murmur. No rub.  No carotid bruit.  Normal pedal pulses.  Abdomen: Soft, nontender, no hepatosplenomegaly, no distention.  Neurologic: Alert and oriented x 3.  Psych: Normal affect. Extremities: No edema or cyanosis.   TELEMETRY: Reviewed telemetry pt in NSR  ASSESSMENT AND PLAN: 52 yo presented  with anterior STEMI, now s/p DES to LAD, also with moderate LV systolic dysfunction and paroxysmal atrial fibrillation.  1. CAD: s/p anterior STEMI, DES to LAD.  Continue ASA 81/Effient, high dose atorvastatin.  Continue cardiac rehab. 2. Ischemic cardiomyopathy: EF 35-40% by echo post-anterior MI.  Continue Coreg and spironolactone.  Hold off on ACEI with soft blood pressure.  She does not appear volume overloaded today and is not short of breath at rest.  Will need echo in 3 mos to reassess EF.  3. Atrial fibrillation: Patient has stayed in NSR since Friday.  She is currently on heparin gtt. I would favor continued dual antiplatelet therapy for now. Afib only occurred in setting of acute STEMI. Continue amiodarone. If she has recurrent Afib would need to consider anticoagulation.  4. Chest  pain. No diagnostic Ecg changes. Different than MI pain. Will try analgesics. Continue heparin for now. Will cycle troponins x 2. Will need to keep in hospital today.     Theron Arista [facility] 07/11/2013

## 2013-07-11 NOTE — Progress Notes (Addendum)
07/11/2013 0800 Nursing note Upon morning assessment pt. C/o Cp 3/10 under left breast. Describes as "indigestion" pain and constant. Pt. States pain is worse with deep breaths as well as worse with certain movements. 2L 02 [place] applied. BP 87/51. Unable to administer SL NTG due to low BP per protocol. EKG performed. Questionable ST elevation in Leads V2 and V3. Ronie Spies [facility] paged and made aware. No new orders received at this time. Dr. [place] to see pt.  Jamaria Amborn, Blanchard Kelch

## 2013-07-11 NOTE — Progress Notes (Addendum)
07/11/2013 2:05 PM Nursing note Ward Givens NP paged and made aware of Troponin results. No new orders at this time. Will continue to monitor patient.  Mosetta Ferdinand, Blanchard Kelch

## 2013-07-12 ENCOUNTER — Encounter (HOSPITAL_COMMUNITY)
Admission: EM | Disposition: A | Discharge status: Home / Self Care | Payer: Self-pay | Source: Home / Self Care | Attending: Cardiology

## 2013-07-12 DIAGNOSIS — I2510 Atherosclerotic heart disease of native coronary artery without angina pectoris: Secondary | ICD-10-CM

## 2013-07-12 HISTORY — PX: LEFT HEART CATHETERIZATION WITH CORONARY ANGIOGRAM: SHX5451

## 2013-07-12 LAB — CBC
Hemoglobin: 11 g/dL — ABNORMAL LOW (ref 12.0–15.0)
MCH: 29.4 pg (ref 26.0–34.0)
MCV: 90.6 fL (ref 78.0–100.0)
Platelets: 187 10*3/uL (ref 150–400)
RBC: 3.74 MIL/uL — ABNORMAL LOW (ref 3.87–5.11)
WBC: 8.6 10*3/uL (ref 4.0–10.5)

## 2013-07-12 LAB — BASIC METABOLIC PANEL
BUN: 9 mg/dL (ref 6–23)
Chloride: 103 mEq/L (ref 96–112)
GFR calc Af Amer: >90 mL/min (ref >90)
Glucose, Bld: 105 mg/dL — ABNORMAL HIGH (ref 70–99)
Potassium: 3.9 mEq/L (ref 3.5–5.1)
Sodium: 135 mEq/L (ref 135–145)

## 2013-07-12 LAB — POCT ACTIVATED CLOTTING TIME: Activated Clotting Time: 667 seconds

## 2013-07-12 SURGERY — LEFT HEART CATHETERIZATION WITH CORONARY ANGIOGRAM
Anesthesia: LOCAL

## 2013-07-12 MED ORDER — FENTANYL CITRATE 0.05 MG/ML IJ SOLN
INTRAMUSCULAR | Status: AC
  Filled 2013-07-12: qty 2

## 2013-07-12 MED ORDER — SODIUM CHLORIDE 0.9 % IV SOLN: 1.0000 mL/kg/h | INTRAVENOUS | Status: AC

## 2013-07-12 MED ORDER — HEPARIN (PORCINE) IN NACL 2-0.9 UNIT/ML-% IJ SOLN
INTRAMUSCULAR | Status: AC
  Filled 2013-07-12: qty 500

## 2013-07-12 MED ORDER — LISINOPRIL 2.5 MG PO TABS
2.5000 mg | ORAL_TABLET | Freq: Every day | ORAL | Status: DC
  Administered 2013-07-12 – 2013-07-13 (×2): 2.5 mg via ORAL
  Filled 2013-07-12 (×2): qty 1

## 2013-07-12 MED ORDER — HEPARIN SODIUM (PORCINE) 1000 UNIT/ML IJ SOLN
INTRAMUSCULAR | Status: AC
  Filled 2013-07-12: qty 1

## 2013-07-12 MED ORDER — MIDAZOLAM HCL 2 MG/2ML IJ SOLN
INTRAMUSCULAR | Status: AC
  Filled 2013-07-12: qty 2

## 2013-07-12 MED ORDER — VERAPAMIL HCL 2.5 MG/ML IV SOLN
INTRAVENOUS | Status: AC
  Filled 2013-07-12: qty 2

## 2013-07-12 MED ORDER — MAGNESIUM HYDROXIDE 400 MG/5ML PO SUSP
30.0000 mL | Freq: Every day | ORAL | Status: DC | PRN
  Administered 2013-07-12: 30 mL via ORAL
  Filled 2013-07-12: qty 30

## 2013-07-12 MED ORDER — NITROGLYCERIN 0.2 MG/ML ON CALL CATH LAB
INTRAVENOUS | Status: AC
  Filled 2013-07-12: qty 1

## 2013-07-12 MED ORDER — LIDOCAINE HCL (PF) 1 % IJ SOLN
INTRAMUSCULAR | Status: AC
  Filled 2013-07-12: qty 30

## 2013-07-12 MED ORDER — ALUM & MAG HYDROXIDE-SIMETH 200-200-20 MG/5ML PO SUSP: 30.0000 mL | ORAL | Status: DC | PRN

## 2013-07-12 MED ORDER — HEPARIN (PORCINE) IN NACL 2-0.9 UNIT/ML-% IJ SOLN
INTRAMUSCULAR | Status: AC
  Filled 2013-07-12: qty 1000

## 2013-07-12 MED FILL — Heparin Sodium (Porcine) 2 Unit/ML in Sodium Chloride 0.9%: INTRAMUSCULAR | Qty: 1000 | Status: AC

## 2013-07-12 MED FILL — Bivalirudin Trifluoroacetate For IV Soln 250 MG (Base Equiv): INTRAVENOUS | Qty: 250 | Status: AC

## 2013-07-12 MED FILL — Nitroglycerin IV Soln 200 MCG/ML in D5W: INTRAVENOUS | Qty: 1 | Status: AC

## 2013-07-12 MED FILL — Verapamil HCl IV Soln 2.5 MG/ML: INTRAVENOUS | Qty: 2 | Status: AC

## 2013-07-12 MED FILL — Lidocaine HCl Local Preservative Free (PF) Inj 1%: INTRAMUSCULAR | Qty: 30 | Status: AC

## 2013-07-12 MED FILL — Famotidine in NaCl 0.9% IV Soln 20 MG/50ML: INTRAVENOUS | Qty: 50 | Status: AC

## 2013-07-12 MED FILL — Sodium Chloride IV Soln 0.9%: INTRAVENOUS | Qty: 50 | Status: AC

## 2013-07-12 NOTE — Progress Notes (Signed)
TELEMETRY: Reviewed telemetry pt in NSR, no Afib.: Filed Vitals:   07/11/13 1759 07/11/13 1949 07/12/13 0501 07/12/13 0800  BP: 93/64 103/71 106/63 97/61  Pulse: 76 76 69   Temp:  98.4 F (36.9 C) 98.2 F (36.8 C)   TempSrc:  Oral Oral   Resp:  18 20   Height:      Weight:      SpO2:  99% 98%     Intake/Output Summary (Last 24 hours) at 07/12/13 1156 Last data filed at 07/12/13 0900  Gross per 24 hour  Intake   1150 ml  Output    200 ml  Net    950 ml    SUBJECTIVE Patient continues to complain of chest pain across her precordium. She describes this as a pressure sensation. Compared to yesterday her pain is more diffuse. No SOB. Pain is constant.  LABS: Basic Metabolic Panel:  Recent Labs  53/66/44 0440 07/12/13 0500  NA 135 135  K 3.3* 3.9  CL 103 103  CO2 22 25  GLUCOSE 107* 105*  BUN 9 9  CREATININE 0.61 0.69  CALCIUM 8.4 8.7   CBC:  Recent Labs  07/11/13 0440 07/12/13 0500  WBC 11.4* 8.6  HGB 11.5* 11.0*  HCT 33.4* 33.9*  MCV 89.3 90.6  PLT 180 187   Cardiac Enzymes:  Recent Labs  07/11/13 0905 07/11/13 1436  TROPONINI 11.67* 7.00*   Radiology/Studies:  Dg Chest Port 1 View  07/08/2013   *RADIOLOGY REPORT*  Clinical Data: ST elevation myocardial infarction.  PORTABLE CHEST - 1 VIEW  Comparison: No priors.  Findings: Unusual linear opacity projecting to the right of the right hilum either within the right lower lobe or right middle lobe.  This may represent an area of subsegmental atelectasis or scarring, but is of uncertain etiology and significance.  Lungs are otherwise clear.  No pleural effusions.  No evidence of pulmonary edema.  Heart size appears borderline to mildly enlarged. Mediastinal contours are unremarkable.  IMPRESSION: 1.  No radiographic evidence of acute cardiopulmonary disease. 2.  Borderline cardiac enlargement. 3.  Linear opacity in the right mid to lower lung, as above, favored to represent subsegmental atelectasis or  scarring. Attention on follow-up studies is recommended.   Original Report Authenticated By: Trudie Reed, M.D.   Ecg: NSR with anteroseptal MI. Mild residual ST elevation.  PHYSICAL EXAM General: Well developed, well nourished, in no acute distress. Head: Normal Neck: Negative for carotid bruits. JVD not elevated. Lungs: Clear bilaterally to auscultation without wheezes, rales, or rhonchi. Breathing is unlabored. Heart: RRR S1 S2 without murmurs, rubs, or gallops.  Abdomen: Soft, non-tender, non-distended with normoactive bowel sounds. No hepatomegaly.  Msk:  Strength and tone appears normal for age. Extremities: No clubbing, cyanosis or edema.  Distal pedal pulses are 2+ and equal bilaterally. Mild bruising right wrist. Neuro: Alert and oriented X 3. Moves all extremities spontaneously. Psych:  Responds to questions appropriately with a normal affect.  ASSESSMENT AND PLAN: 1. STEMI s/p stenting of the proximal LAD with DES. Patient with recurrent chest pain. Symptoms somewhat atypical but concerning in light of elevated troponins. Troponin elevation could represent reinfarction versus late washout from her original MI. I recommend repeat angiography to rule out reocclusion.  2. Acute systolic CHF. Ischemic cardiomyopathy. BP soft. ACEI on hold for now.  3. Atrial fibrillation- resolved. On amio. If cath OK will stop heparin.   Principal Problem:   ST elevation myocardial infarction (STEMI) of anterior wall  Active Problems:   Atrial fibrillation   Dyslipidemia   Hyperglycemia   PAD (peripheral artery disease)   Cardiomyopathy, ischemic   CAD (coronary artery disease)    Signed, Havah Ammon [place] MD,FACC 07/12/2013 12:03 PM

## 2013-07-12 NOTE — H&P (View-Only) (Signed)
 TELEMETRY: Reviewed telemetry pt in NSR, no Afib.: Filed Vitals:   07/11/13 1759 07/11/13 1949 07/12/13 0501 07/12/13 0800  BP: 93/64 103/71 106/63 97/61  Pulse: 76 76 69   Temp:  98.4 F (36.9 C) 98.2 F (36.8 C)   TempSrc:  Oral Oral   Resp:  18 20   Height:      Weight:      SpO2:  99% 98%     Intake/Output Summary (Last 24 hours) at 07/12/13 1156 Last data filed at 07/12/13 0900  Gross per 24 hour  Intake   1150 ml  Output    200 ml  Net    950 ml    SUBJECTIVE Patient continues to complain of chest pain across her precordium. She describes this as a pressure sensation. Compared to yesterday her pain is more diffuse. No SOB. Pain is constant.  LABS: Basic Metabolic Panel:  Recent Labs  07/11/13 0440 07/12/13 0500  NA 135 135  K 3.3* 3.9  CL 103 103  CO2 22 25  GLUCOSE 107* 105*  BUN 9 9  CREATININE 0.61 0.69  CALCIUM 8.4 8.7   CBC:  Recent Labs  07/11/13 0440 07/12/13 0500  WBC 11.4* 8.6  HGB 11.5* 11.0*  HCT 33.4* 33.9*  MCV 89.3 90.6  PLT 180 187   Cardiac Enzymes:  Recent Labs  07/11/13 0905 07/11/13 1436  TROPONINI 11.67* 7.00*   Radiology/Studies:  Dg Chest Port 1 View  07/08/2013   *RADIOLOGY REPORT*  Clinical Data: ST elevation myocardial infarction.  PORTABLE CHEST - 1 VIEW  Comparison: No priors.  Findings: Unusual linear opacity projecting to the right of the right hilum either within the right lower lobe or right middle lobe.  This may represent an area of subsegmental atelectasis or scarring, but is of uncertain etiology and significance.  Lungs are otherwise clear.  No pleural effusions.  No evidence of pulmonary edema.  Heart size appears borderline to mildly enlarged. Mediastinal contours are unremarkable.  IMPRESSION: 1.  No radiographic evidence of acute cardiopulmonary disease. 2.  Borderline cardiac enlargement. 3.  Linear opacity in the right mid to lower lung, as above, favored to represent subsegmental atelectasis or  scarring. Attention on follow-up studies is recommended.   Original Report Authenticated By: Daniel Entrikin, M.D.   Ecg: NSR with anteroseptal MI. Mild residual ST elevation.  PHYSICAL EXAM General: Well developed, well nourished, in no acute distress. Head: Normal Neck: Negative for carotid bruits. JVD not elevated. Lungs: Clear bilaterally to auscultation without wheezes, rales, or rhonchi. Breathing is unlabored. Heart: RRR S1 S2 without murmurs, rubs, or gallops.  Abdomen: Soft, non-tender, non-distended with normoactive bowel sounds. No hepatomegaly.  Msk:  Strength and tone appears normal for age. Extremities: No clubbing, cyanosis or edema.  Distal pedal pulses are 2+ and equal bilaterally. Mild bruising right wrist. Neuro: Alert and oriented X 3. Moves all extremities spontaneously. Psych:  Responds to questions appropriately with a normal affect.  ASSESSMENT AND PLAN: 1. STEMI s/p stenting of the proximal LAD with DES. Patient with recurrent chest pain. Symptoms somewhat atypical but concerning in light of elevated troponins. Troponin elevation could represent reinfarction versus late washout from her original MI. I recommend repeat angiography to rule out reocclusion.  2. Acute systolic CHF. Ischemic cardiomyopathy. BP soft. ACEI on hold for now.  3. Atrial fibrillation- resolved. On amio. If cath OK will stop heparin.   Principal Problem:   ST elevation myocardial infarction (STEMI) of anterior wall   Active Problems:   Atrial fibrillation   Dyslipidemia   Hyperglycemia   PAD (peripheral artery disease)   Cardiomyopathy, ischemic   CAD (coronary artery disease)    Signed, Letta Cargile MD,FACC 07/12/2013 12:03 PM    

## 2013-07-12 NOTE — CV Procedure (Signed)
   Cardiac Catheterization Procedure Note  Name: Traci Choi MRN: [id number] DOB: [birth year]  Procedure:  Selective Coronary Angiography  Indication: 52 yo BF with recent anterior STEMI on 08/07/13 with stenting of the proximal LAD now has recurrent chest pain.   Procedural Details: The right wrist was prepped, draped, and anesthetized with 1% lidocaine. Using the modified Seldinger technique, a 5 French sheath was introduced into the right radial artery. 3 mg of verapamil was administered through the sheath, weight-based unfractionated heparin was administered intravenously. Standard Judkins catheters were used for selective coronary angiography and left ventriculography. Catheter exchanges were performed over an exchange length guidewire. There were no immediate procedural complications. A TR band was used for radial hemostasis at the completion of the procedure.  The patient was transferred to the post catheterization recovery area for further monitoring.  Procedural Findings: Hemodynamics: AO 104/63 mean 80 mm Hg   Coronary angiography: Coronary dominance: left  Left mainstem: Normal  Left anterior descending (LAD): The stent in the proximal vessel is widely patent with TIMI 3 flow. The remainder of the LAD is normal. The first diagonal is normal.  Left circumflex (LCx): The second diagonal is small in caliber and diffusely diseased up to 50-70%  Right coronary artery (RCA): small nondominant. Normal.  Left ventriculography: Not done.  Final Conclusions:   1. Nonobstructive CAD. The LAD is widely patent.  Recommendations: continue medical therapy.  Theron Arista [facility] 07/12/2013, 12:48 PM

## 2013-07-12 NOTE — Progress Notes (Signed)
ANTICOAGULATION CONSULT NOTE - Follow Up Consult  Pharmacy Consult for heparin Indication: atrial fibrillation  Labs:  Recent Labs  07/10/13 0500 07/11/13 0440 07/11/13 0905 07/11/13 1436 07/12/13 0500  HGB 12.4 11.5*  --   --  11.0*  HCT 37.9 33.4*  --   --  33.9*  PLT 210 180  --   --  187  HEPARINUNFRC 0.55 0.36  --   --  0.23*  CREATININE 0.63 0.61  --   --  0.69  TROPONINI  --   --  11.67* 7.00*  --     Assessment: 52yo female now subtherapeutic on heparin after three levels at goal though had been trending down.  Goal of Therapy:  Heparin level 0.3-0.7 units/ml   Plan:  Will increase heparin gtt by 2 units/kg/hr to 1200 units/hr and check level in 6hr.  Vernard Gambles, PharmD, BCPS  07/12/2013,6:20 AM

## 2013-07-12 NOTE — Progress Notes (Addendum)
CARDIAC REHAB PHASE I   PRE:  Rate/Rhythm: 78 SR    BP: sitting 96/64    SaO2:   MODE:  Ambulation: 500 ft   POST:  Rate/Rhythm: 84 SR    BP: sitting 100/64     SaO2:   Pt continues to c/o "nagging" CP that moves from place to place across her chest. She would categorize it as 3/10, like a band of tightness. Sts it is different than her MI pain. Does not change with any stimulus. Pt was able to go to BR then walk without problems or increase in CP. BP low but stable. Reviewed ed and discussed CRPII, which pt interested in [place] CRPII (45 min drive one way, she sts she doesn't mind). To recliner after walk, seems to be doing well except this nagging CP.  [phone]   Harriet Masson CES, ACSM 07/12/2013 10:32 AM

## 2013-07-12 NOTE — Interval H&P Note (Signed)
History and Physical Interval Note:  07/12/2013 12:24 PM  Traci Choi  has presented today for surgery, with the diagnosis of ANGINA  The various methods of treatment have been discussed with the patient and family. After consideration of risks, benefits and other options for treatment, the patient has consented to  Procedure(s): LEFT HEART CATHETERIZATION WITH CORONARY ANGIOGRAM (N/A) as a surgical intervention .  The patient's history has been reviewed, patient examined, no change in status, stable for surgery.  I have reviewed the patient's chart and labs.  Questions were answered to the patient's satisfaction.    Cath Lab Visit (complete for each Cath Lab visit)  Clinical Evaluation Leading to the Procedure:   ACS: yes  Non-ACS:    Anginal Classification: CCS IV  Anti-ischemic medical therapy: Minimal Therapy (1 class of medications)  Non-Invasive Test Results: No non-invasive testing performed  Prior CABG: No previous CABG       Theron Arista [facility] 07/12/2013 12:24 PM

## 2013-07-13 DIAGNOSIS — I2510 Atherosclerotic heart disease of native coronary artery without angina pectoris: Secondary | ICD-10-CM

## 2013-07-13 LAB — BASIC METABOLIC PANEL
Chloride: 103 mEq/L (ref 96–112)
Creatinine, Ser: 0.59 mg/dL (ref 0.50–1.10)
GFR calc Af Amer: >90 mL/min (ref >90)
Potassium: 3.7 mEq/L (ref 3.5–5.1)
Sodium: 137 mEq/L (ref 135–145)

## 2013-07-13 LAB — CBC
HCT: 33.2 % — ABNORMAL LOW (ref 36.0–46.0)
Hemoglobin: 11.2 g/dL — ABNORMAL LOW (ref 12.0–15.0)
RBC: 3.68 MIL/uL — ABNORMAL LOW (ref 3.87–5.11)
RDW: 14.4 % (ref 11.5–15.5)
WBC: 7.7 10*3/uL (ref 4.0–10.5)

## 2013-07-13 MED ORDER — PRASUGREL HCL 10 MG PO TABS: 10.0000 mg | ORAL_TABLET | Freq: Every day | ORAL | Status: DC

## 2013-07-13 MED ORDER — AMIODARONE HCL 200 MG PO TABS: 200.0000 mg | ORAL_TABLET | Freq: Two times a day (BID) | ORAL | Status: DC

## 2013-07-13 MED ORDER — CARVEDILOL 3.125 MG PO TABS: 3.1250 mg | ORAL_TABLET | Freq: Two times a day (BID) | ORAL | Status: DC

## 2013-07-13 MED ORDER — LISINOPRIL 2.5 MG PO TABS: 2.5000 mg | ORAL_TABLET | Freq: Every day | ORAL | Status: DC

## 2013-07-13 MED ORDER — NITROGLYCERIN 0.4 MG SL SUBL: 0.4000 mg | SUBLINGUAL_TABLET | SUBLINGUAL | Status: DC | PRN

## 2013-07-13 MED ORDER — TRAMADOL HCL 50 MG PO TABS: 50.0000 mg | ORAL_TABLET | Freq: Four times a day (QID) | ORAL | Status: DC | PRN

## 2013-07-13 MED ORDER — SPIRONOLACTONE 25 MG PO TABS: 12.5000 mg | ORAL_TABLET | Freq: Every day | ORAL | Status: DC

## 2013-07-13 MED ORDER — DSS 100 MG PO CAPS: 100.0000 mg | ORAL_CAPSULE | Freq: Two times a day (BID) | ORAL | Status: DC

## 2013-07-13 MED ORDER — ATORVASTATIN CALCIUM 80 MG PO TABS: 80.0000 mg | ORAL_TABLET | Freq: Every day | ORAL | Status: DC

## 2013-07-13 MED ORDER — CARVEDILOL 3.125 MG PO TABS
3.1250 mg | ORAL_TABLET | Freq: Two times a day (BID) | ORAL | Status: DC
  Administered 2013-07-13: 3.125 mg via ORAL
  Filled 2013-07-13 (×3): qty 1

## 2013-07-13 MED ORDER — ASPIRIN 81 MG PO CHEW: 81.0000 mg | CHEWABLE_TABLET | Freq: Every day | ORAL | Status: AC

## 2013-07-13 NOTE — Progress Notes (Signed)
Assessment unchanged. Discussed D/C instructions with pt and family including f/u appointments, NTG use and other new medications, and cath site care. Pt verbalized understanding. IV and tele removed. Pt left via W/C accompanied by [employer].

## 2013-07-13 NOTE — Discharge Summary (Signed)
CARDIOLOGY DISCHARGE SUMMARY   Patient ID: Traci Choi MRN: [id number] DOB/AGE: 1961-08-03 52 y.o.  Admit date: 07/08/2013 Discharge date: 07/13/2013  Primary Discharge Diagnosis:   ST elevation myocardial infarction (STEMI) of anterior wall - S/P  3.0 x 20 mm Promus premier DES to the LAD.  Secondary Discharge Diagnosis:    Atrial fibrillation   Dyslipidemia   Hyperglycemia   PAD (peripheral artery disease)   Cardiomyopathy, ischemic   CAD (coronary artery disease)  Procedures: Left Heart Cath, Selective Coronary Angiography, left ventriculogram, PTCA and stenting of the proximal LAD, 2-D echocardiogram, relook catheterization with selective coronary angiography   Hospital Course: Traci Choi is a 52 y.o. female with a history of CAD. She came to [facility] to visit her husband and had chest pain. She came to the emergency room. Her chest pain worsened when she was walking back into the emergency room and an ECG showed acute ischemic changes. A Code STEMI was activated and she was transferred to [facility].  She was taken to the Cath Lab, with cath results below. She had a drug-eluting stent to the LAD and otherwise nonobstructive disease. Her EF was 35-40%. She tolerated the procedure well. Case management consult was ordered for medication assistance. She will get Effient for 30 days and then has a co-pay card to help with the cost of the ongoing prescription. An assistance form will be filled out as well. She was started on a beta blocker and a statin.   She went into atrial fibrillation and her Coreg dose was increased for rate control. She was started on IV amiodarone. She was continued on amiodarone and spontaneously converted to sinus rhythm. She is to stay on the amiodarone for at least a month, but it may be discontinued after discharge if she maintains sinus rhythm.  She had chest pain on 9/1 and her blood pressure became elevated. She was given IV Lopressor and  started on IV nitroglycerin. Her pain improved and her blood pressure decreased. The nitroglycerin drip was discontinued. Her systolic blood pressure was about 100 so the Coreg dose was decreased.   She continued to have pain and was taken back to cath lab on 07/12/2013. Those cardiac catheterization results are below but the stent was still patent and there was no new obstructive disease and her stent was patent, so continued medical therapy was recommended.  As her blood pressure stabilized, she was started on a low dose of an ACE inhibitor. Hypotension did not allow up-titration of either her ACE inhibitor or a beta blocker.Marland Kitchen She was started on Aldactone at a low dose and did tolerate this. She does not currently need other diuretics. Her blood pressure is well controlled.  She was seen by cardiac rehabilitation and ambulated with them. She was instructed on heart healthy lifestyle changes, exercise guidelines and MI restrictions. 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 She is to follow up here initially, but will then follow up with physicians where she lives in [place], [place]. By 07/13/2013, she was doing much better and considered stable for discharge, to follow up as an outpatient.  Labs:  Lab Results  Component Value Date   WBC 7.7 07/13/2013   HGB 11.2* 07/13/2013   HCT 33.2* 07/13/2013   MCV 90.2 07/13/2013   PLT 203 07/13/2013    Recent Labs Lab 07/08/13 0128  07/13/13 0400  NA 139  < >  137  K 3.8  < > 3.7  CL 105  < > 103  CO2 25  < > 23  BUN 11  < > 9  CREATININE 0.56  < > 0.59  CALCIUM 9.3  < > 8.6  PROT 7.3  --   --   BILITOT 0.2*  --   --   ALKPHOS 76  --   --   ALT 13  --   --   AST 14  --   --   GLUCOSE 117*  < > 104*  < > = values in this interval not displayed.  Recent Labs  07/11/13 0905 07/11/13 1436  TROPONINI 11.67* 7.00*   Lipid Panel     Component Value Date/Time   CHOL 208* 07/08/2013  0430   TRIG 277* 07/08/2013 0430   HDL 48 07/08/2013 0430   CHOLHDL 4.3 07/08/2013 0430   VLDL 55* 07/08/2013 0430   LDLCALC 105* 07/08/2013 0430      Radiology: Dg Chest Port 1 View 07/08/2013   *RADIOLOGY REPORT*  Clinical Data: ST elevation myocardial infarction.  PORTABLE CHEST - 1 VIEW  Comparison: No priors.  Findings: Unusual linear opacity projecting to the right of the right hilum either within the right lower lobe or right middle lobe.  This may represent an area of subsegmental atelectasis or scarring, but is of uncertain etiology and significance.  Lungs are otherwise clear.  No pleural effusions.  No evidence of pulmonary edema.  Heart size appears borderline to mildly enlarged. Mediastinal contours are unremarkable.  IMPRESSION: 1.  No radiographic evidence of acute cardiopulmonary disease. 2.  Borderline cardiac enlargement. 3.  Linear opacity in the right mid to lower lung, as above, favored to represent subsegmental atelectasis or scarring. Attention on follow-up studies is recommended.   Original Report Authenticated By: Trudie Reed, M.D.   Cardiac Cath: 07/08/2013 Left mainstem: The left main coronary was short and without significant disease.  Left anterior descending (LAD): The left anterior descending artery was occluded proximally with TIMI grade 0 flow.  Left circumflex (LCx): The left circumflex was a dominant vessel. It gives rise to 2 marginal branches in the proximal and mid vessel. It then terminates in a small posterior lateral branch and the PDA. The left circumflex proper has no significant disease. The first obtuse marginal vessel as 40% narrowing proximally. The second marginal branch is relatively small with diffuse 70-80% disease proximally. There is also a segmental 70-80% stenosis in the proximal PDA.  Right coronary artery (RCA): The right coronary is a small nondominant vessel and is normal.  Left ventriculography: Left ventricular size is normal. There is severe  hypokinesis involving the mid to distal anterior wall. There is akinesis of the apex and distal inferior wall. Ejection fraction is estimated at 35-40%. PCI Data:  Vessel - LAD/Segment - proximal  Percent Stenosis (pre) 100%  TIMI-flow 0  Stent 3.0 x 20 mm Promus premier  Percent Stenosis (post) 0%  TIMI-flow (post) 3  Final Conclusions:  1. 2 vessel obstructive coronary disease. The culprit is the occlusion in the proximal LAD.  2. Moderate to severe left ventricular dysfunction  3. Successful stenting of the proximal LAD with a drug-eluting stent.   Relook catheterization 07/12/2013 Left mainstem: Normal  Left anterior descending (LAD): The stent in the proximal vessel is widely patent with TIMI 3 flow. The remainder of the LAD is normal. The first diagonal is normal.  Left circumflex (LCx): The second diagonal is small in  caliber and diffusely diseased up to 50-70%  Right coronary artery (RCA): small nondominant. Normal. Final Conclusions:  1. Nonobstructive CAD. The LAD is widely patent.  Recommendations: continue medical therapy.   EKG: Initial ecgs with 1mm ST elevation in anterior leads around 1:14am, this progressive to 2-31mm ST elevation around 1:43  Echo:  07/08/2013 Study Conclusions Left ventricle: LVEF is approximately 35 to 40% with akinesis of the distal inferior, mid/distal septal and apical walls. The cavity size was normal. Wall thickness was normal.    FOLLOW UP PLANS AND APPOINTMENTS Allergies  Allergen Reactions  . Flagyl [Metronidazole] Other (See Comments)    unknown  . Penicillins Nausea And Vomiting and Rash     Medication List         albuterol 108 (90 BASE) MCG/ACT inhaler  Commonly known as:  PROVENTIL HFA;VENTOLIN HFA  Inhale 2 puffs into the lungs every 6 (six) hours as needed for wheezing.     amiodarone 200 MG tablet  Commonly known as:  PACERONE  Take 1 tablet (200 mg total) by mouth 2 (two) times daily.     aspirin 81 MG chewable  tablet  Chew 1 tablet (81 mg total) by mouth daily.     atorvastatin 80 MG tablet  Commonly known as:  LIPITOR  Take 1 tablet (80 mg total) by mouth daily at 6 PM.     carvedilol 3.125 MG tablet  Commonly known as:  COREG  Take 1 tablet (3.125 mg total) by mouth 2 (two) times daily with a meal.     DSS 100 MG Caps  Take 100 mg by mouth 2 (two) times daily.     esomeprazole 40 MG capsule  Commonly known as:  NEXIUM  Take 40 mg by mouth daily before breakfast.     fluticasone 50 MCG/ACT nasal spray  Commonly known as:  FLONASE  Place 2 sprays into both nostrils daily as needed for allergies.     lisinopril 2.5 MG tablet  Commonly known as:  PRINIVIL,ZESTRIL  Take 1 tablet (2.5 mg total) by mouth daily.     nitroGLYCERIN 0.4 MG SL tablet  Commonly known as:  NITROSTAT  Place 1 tablet (0.4 mg total) under the tongue every 5 (five) minutes as needed for chest pain.     prasugrel 10 MG Tabs tablet  Commonly known as:  EFFIENT  Take 1 tablet (10 mg total) by mouth daily.     spironolactone 25 MG tablet  Commonly known as:  ALDACTONE  Take 0.5 tablets (12.5 mg total) by mouth daily.     traMADol 50 MG tablet  Commonly known as:  ULTRAM  Take 1 tablet (50 mg total) by mouth every 6 (six) hours as needed.        Discharge Orders   Future Appointments Provider Department Dept Phone   07/28/2013 11:10 AM Beatrice Lecher, PA-C [facility] Main Office [place]) [phone]   Future Orders Complete By Expires   (HEART FAILURE PATIENTS) Call MD:  Anytime you have any of the following symptoms: 1) 3 pound weight gain in 24 hours or 5 pounds in 1 week 2) shortness of breath, with or without a dry hacking cough 3) swelling in the hands, feet or stomach 4) if you have to sleep on extra pillows at night in order to breathe.  As directed    Diet - low sodium heart healthy  As directed    Increase activity slowly  As directed  Follow-up Information   Follow up with Tereso Newcomer, PA-C On 07/28/2013. (See [facility] at 11:10 am.)    Specialty:  Physician Assistant   Contact information:   [address] [place] [address] [phone]       BRING ALL MEDICATIONS WITH YOU TO FOLLOW UP APPOINTMENTS  Time spent with patient to include physician time: 46 min Signed: Theodore Demark, PA-C 07/13/2013, 12:16 PM Co-Sign MD

## 2013-07-13 NOTE — Discharge Summary (Signed)
Patient seen and examined and history reviewed. Agree with above findings and plan. See earlier rounding note.  Zackrey Dyar JordanMD 07/13/2013 1:19 PM    

## 2013-07-13 NOTE — Progress Notes (Signed)
TELEMETRY: Reviewed telemetry pt in NSR, no Afib.: Filed Vitals:   07/12/13 1219 07/12/13 1500 07/12/13 2059 07/13/13 0549  BP:  91/58 103/52 97/64  Pulse: 73 83 67 71  Temp:   98.8 F (37.1 C) 98 F (36.7 C)  TempSrc:   Oral Oral  Resp:  16 18 18   Height:      Weight:      SpO2:  98% 99% 94%    Intake/Output Summary (Last 24 hours) at 07/13/13 0825 Last data filed at 07/12/13 0900  Gross per 24 hour  Intake    120 ml  Output      0 ml  Net    120 ml    SUBJECTIVE Patient feels much better today. Received Morphine x 1 last night with complete resolution of chest pain. Good BM today with MOM.   LABS: Basic Metabolic Panel:  Recent Labs  81/19/14 0500 07/13/13 0400  NA 135 137  K 3.9 3.7  CL 103 103  CO2 25 23  GLUCOSE 105* 104*  BUN 9 9  CREATININE 0.69 0.59  CALCIUM 8.7 8.6   CBC:  Recent Labs  07/12/13 0500 07/13/13 0400  WBC 8.6 7.7  HGB 11.0* 11.2*  HCT 33.9* 33.2*  MCV 90.6 90.2  PLT 187 203   Cardiac Enzymes:  Recent Labs  07/11/13 0905 07/11/13 1436  TROPONINI 11.67* 7.00*   Radiology/Studies:  Dg Chest Port 1 View  07/08/2013   *RADIOLOGY REPORT*  Clinical Data: ST elevation myocardial infarction.  PORTABLE CHEST - 1 VIEW  Comparison: No priors.  Findings: Unusual linear opacity projecting to the right of the right hilum either within the right lower lobe or right middle lobe.  This may represent an area of subsegmental atelectasis or scarring, but is of uncertain etiology and significance.  Lungs are otherwise clear.  No pleural effusions.  No evidence of pulmonary edema.  Heart size appears borderline to mildly enlarged. Mediastinal contours are unremarkable.  IMPRESSION: 1.  No radiographic evidence of acute cardiopulmonary disease. 2.  Borderline cardiac enlargement. 3.  Linear opacity in the right mid to lower lung, as above, favored to represent subsegmental atelectasis or scarring. Attention on follow-up studies is recommended.    Original Report Authenticated By: Trudie Reed, M.D.   Ecg: NSR with anteroseptal MI. Mild residual ST elevation.  PHYSICAL EXAM General: Well developed, well nourished, in no acute distress. Head: Normal Neck: Negative for carotid bruits. JVD not elevated. Lungs: Clear bilaterally to auscultation without wheezes, rales, or rhonchi. Breathing is unlabored. Heart: RRR S1 S2 without murmurs, rubs, or gallops.  Abdomen: Soft, non-tender, non-distended with normoactive bowel sounds. No hepatomegaly.  Msk:  Strength and tone appears normal for age. Extremities: No clubbing, cyanosis or edema.  Distal pedal pulses are 2+ and equal bilaterally. Mild bruising right wrist. No hematoma. Neuro: Alert and oriented X 3. Moves all extremities spontaneously. Psych:  Responds to questions appropriately with a normal affect.  ASSESSMENT AND PLAN: 1. STEMI s/p stenting of the proximal LAD with DES. Patient with recurrent chest pain. Repeat cath showed widely patent stent in LAD. Pain now resolved. On ASA and Effient. 2. Acute systolic CHF. Ischemic cardiomyopathy. Continue ACEi, coreg, aldactone. Follow up Echo 3 months. 3. Atrial fibrillation- resolved. On amio. Plan on stopping amio if maintains NSR on follow up. 4. Dyslipidemia. On statin.  Plan: will DC home today. Recommend cardiac Rehab as outpatient - probably in [place]. Needs follow up in 2 weeks. Will need  to stay out of work at least until her follow up visit.   Principal Problem:   ST elevation myocardial infarction (STEMI) of anterior wall Active Problems:   Atrial fibrillation   Dyslipidemia   Hyperglycemia   PAD (peripheral artery disease)   Cardiomyopathy, ischemic   CAD (coronary artery disease)    Signed, Peter Swaziland MD,FACC 07/13/2013 8:25 AM

## 2013-07-15 ENCOUNTER — Telehealth: Payer: Self-pay | Admitting: Cardiology

## 2013-07-15 NOTE — Telephone Encounter (Signed)
Returned call to cardiac rehab in [place] already closed will call back Monday 07/18/13.

## 2013-07-15 NOTE — Telephone Encounter (Signed)
New problem    From discharge notes stated patient to have outpatient cardiac rehab in [place]. Please advise .    Office  Phone # [phone]. Fax # [phone] . Attention mary.

## 2013-07-19 ENCOUNTER — Telehealth: Payer: Self-pay | Admitting: Cardiology

## 2013-07-19 NOTE — Telephone Encounter (Signed)
LMOVM for Traci Choi/Cardio-Pulmonary Rehab to return my call Need Cardiac-Rehab  Paper faxed back to be able to Send Records 07/19/13/KM

## 2013-07-19 NOTE — Telephone Encounter (Signed)
New Problem  Pt is concerned because cardiac rehab has not recieved any information??? Pt is not sure of the information that needed but asks that it is faxed to begin rehab.  Mary @ cardiac rehab [phone] to gain understanding of what is needed to begin. FAx# [phone]

## 2013-07-19 NOTE — Telephone Encounter (Signed)
Returned call to patient's sister Tameaka Eichhorn she stated [facility] Re[facility] in [place] still has not received patient's records.Spoke to [place] at [facility]iac Rehab in [place] she will refax to Selena Batten in our medical records the form Dr.Jordan needs to sign and a list of what records to sent.

## 2013-07-25 MED ORDER — LOSARTAN POTASSIUM 25 MG PO TABS: 25.0000 mg | ORAL_TABLET | Freq: Every day | ORAL | Status: DC

## 2013-07-25 NOTE — Telephone Encounter (Signed)
New Problem  Pt has had a very dry cough for three days/// asks is there a recommendation of what needs to be done// believes she is to call cardiologist for this issue due to prev heart attack/.

## 2013-07-25 NOTE — Telephone Encounter (Signed)
Returned call to patient's sister Georgiana Spinner.Dr.Jordan advised to stop lisinopril and start losartan 25 mg daily.Advised to keep appointment with Tereso Newcomer PA 07/28/13.

## 2013-07-27 ENCOUNTER — Telehealth: Payer: Self-pay | Admitting: Cardiology

## 2013-07-27 NOTE — Telephone Encounter (Signed)
Spoke with patient who states she is still coughing and has tenderness in bilateral rib area.  Patient states she stopped Lisinopril and started Losartan yesterday.  Patient denies fever, denies productive cough; states she is SOB when she is coughing.  Patient states she has acid reflux that causes coughing as well; states she is compliant with her Nexium. I advised patient that she can try Mucinex without decongestant and/or go to urgent care.  Patient has an appointment with Tereso Newcomer, PA-C tomorrow 9/18.  Patient verbalized understanding and agreement with plan of care.

## 2013-07-27 NOTE — Telephone Encounter (Signed)
New Problem   Pt was told to stop taking medication but her cough has been really bad.. cheast and ribs are hurting from coughing.. Request to speak with a nurse a soon as possible.

## 2013-07-28 ENCOUNTER — Telehealth: Payer: Self-pay | Admitting: *Deleted

## 2013-07-28 ENCOUNTER — Encounter: Payer: Self-pay | Admitting: *Deleted

## 2013-07-28 ENCOUNTER — Other Ambulatory Visit (INDEPENDENT_AMBULATORY_CARE_PROVIDER_SITE_OTHER): Payer: BC Managed Care – PPO

## 2013-07-28 ENCOUNTER — Ambulatory Visit (INDEPENDENT_AMBULATORY_CARE_PROVIDER_SITE_OTHER): Payer: BC Managed Care – PPO | Admitting: Physician Assistant

## 2013-07-28 ENCOUNTER — Encounter: Payer: Self-pay | Admitting: Physician Assistant

## 2013-07-28 ENCOUNTER — Ambulatory Visit (INDEPENDENT_AMBULATORY_CARE_PROVIDER_SITE_OTHER)
Admission: RE | Admit: 2013-07-28 | Discharge: 2013-07-28 | Disposition: A | Discharge status: Home / Self Care | Payer: BC Managed Care – PPO | Source: Ambulatory Visit | Attending: Physician Assistant | Admitting: Physician Assistant

## 2013-07-28 VITALS — BP 126/70 | HR 57 | Ht 60.0 in | Wt 177.0 lb

## 2013-07-28 DIAGNOSIS — I739 Peripheral vascular disease, unspecified: Secondary | ICD-10-CM

## 2013-07-28 DIAGNOSIS — I255 Ischemic cardiomyopathy: Secondary | ICD-10-CM

## 2013-07-28 DIAGNOSIS — I2589 Other forms of chronic ischemic heart disease: Secondary | ICD-10-CM

## 2013-07-28 DIAGNOSIS — J449 Chronic obstructive pulmonary disease, unspecified: Secondary | ICD-10-CM

## 2013-07-28 DIAGNOSIS — R059 Cough, unspecified: Secondary | ICD-10-CM

## 2013-07-28 DIAGNOSIS — R05 Cough: Secondary | ICD-10-CM

## 2013-07-28 DIAGNOSIS — I2510 Atherosclerotic heart disease of native coronary artery without angina pectoris: Secondary | ICD-10-CM

## 2013-07-28 DIAGNOSIS — I4891 Unspecified atrial fibrillation: Secondary | ICD-10-CM

## 2013-07-28 DIAGNOSIS — K219 Gastro-esophageal reflux disease without esophagitis: Secondary | ICD-10-CM | POA: Insufficient documentation

## 2013-07-28 DIAGNOSIS — E785 Hyperlipidemia, unspecified: Secondary | ICD-10-CM

## 2013-07-28 DIAGNOSIS — I5022 Chronic systolic (congestive) heart failure: Secondary | ICD-10-CM

## 2013-07-28 DIAGNOSIS — J4489 Other specified chronic obstructive pulmonary disease: Secondary | ICD-10-CM

## 2013-07-28 LAB — CBC WITH DIFFERENTIAL/PLATELET
Basophils Absolute: 0 10*3/uL (ref 0.0–0.1)
Eosinophils Absolute: 0.1 10*3/uL (ref 0.0–0.7)
HCT: 38.9 % (ref 36.0–46.0)
Hemoglobin: 12.9 g/dL (ref 12.0–15.0)
Lymphs Abs: 2 10*3/uL (ref 0.7–4.0)
MCHC: 33.1 g/dL (ref 30.0–36.0)
Monocytes Absolute: 0.8 10*3/uL (ref 0.1–1.0)
Neutro Abs: 6 10*3/uL (ref 1.4–7.7)
Platelets: 299 10*3/uL (ref 150.0–400.0)
RDW: 13.8 % (ref 11.5–14.6)

## 2013-07-28 LAB — BASIC METABOLIC PANEL
BUN: 10 mg/dL (ref 6–23)
CO2: 25 mEq/L (ref 19–32)
Glucose, Bld: 91 mg/dL (ref 70–99)
Potassium: 4.2 mEq/L (ref 3.5–5.1)
Sodium: 138 mEq/L (ref 135–145)

## 2013-07-28 LAB — HEPATIC FUNCTION PANEL
Albumin: 3.6 g/dL (ref 3.5–5.2)
Total Protein: 7.9 g/dL (ref 6.0–8.3)

## 2013-07-28 LAB — LIPID PANEL
Cholesterol: 120 mg/dL (ref 0–200)
HDL: 39.6 mg/dL (ref >39.00)
Triglycerides: 64 mg/dL (ref 0.0–149.0)

## 2013-07-28 LAB — BRAIN NATRIURETIC PEPTIDE: Pro B Natriuretic peptide (BNP): 379 pg/mL — ABNORMAL HIGH (ref 0.0–100.0)

## 2013-07-28 MED ORDER — ESOMEPRAZOLE MAGNESIUM 40 MG PO CPDR: 40.0000 mg | DELAYED_RELEASE_CAPSULE | Freq: Two times a day (BID) | ORAL | Status: DC

## 2013-07-28 MED ORDER — AMIODARONE HCL 200 MG PO TABS: 200.0000 mg | ORAL_TABLET | ORAL | Status: DC

## 2013-07-28 NOTE — Patient Instructions (Addendum)
INCREASE NEXIUM TO 40 MG 1 CAP TWICE DAILY; REFILL SENT IN TODAY  FINISH THE BOTTLE OF AMIODARONE AND WHEN DONE YOU WILL THEN STOP TAKING AMIODARONE  LABS AND CHEST X-RAY TODAY AT [facility] ON [address];  BMET, CBC W/DIFF, BNP, CXR  FASTING LIPID AND LIVER TO BE DONE IN ABOUT 6 WEEKS SAME DAY YOU SEE DR. Swaziland  YOU HAVE BEEN GIVEN A  WORK NOTE TODAY AS WELL

## 2013-07-28 NOTE — Progress Notes (Signed)
[address] [place], [place]  [address] Phone: [phone] Fax:  [phone]  Date:  07/28/2013   ID:  [name], DOB March 01, 1961, MRN [id number]  PCP:  No primary provider on file.  Cardiologist:  Dr. Peter Swaziland     History of Present Illness: Traci Choi is a 52 y.o. female who returns for followup after recent admission to the hospital with an anterior STEMI.  She lives in [place], [place].  She has a hx of PVD, s/p abdominal aorta stenting in the past.  She presented to the ED with worsening chest pain. ECG demonstrated anterior STE. Emergent cardiac catheterization demonstrated an occluded proximal LAD which was treated with a Promus DES. She also had moderate residual disease in the marginal branch of the circumflex as well as the PDA which would be treated medically. EF was 35-40%. Follow up echocardiogram demonstrated EF 35-40%. She developed atrial fibrillation and was started on IV amiodarone. She converted to NSR. Plan is to keep her on amiodarone for at least a month. She had recurrent chest pain and underwent re-look catheterization 07/12/13. This demonstrated a patent stent in the LAD and otherwise nonobstructive disease. Medical therapy was continued. Soft blood pressures limited titration of her medications.  She will need a follow up echocardiogram in 3 months post MI. If EF remains less than 35%, she will need consideration for ICD.  She notes occasional chest pains. These can occur at any time. She sometimes notes pain after meals. She also points to her epigastrium. Anginal symptoms she presented to the hospital with have resolved. She does note some occasional shortness of breath. She's probably NYHA class IIb. She's been sleeping on 2 pillows. She denies PND. She does note some ankle edema. She denies syncope. She called the office recently with cough that is nonproductive. Her ACE inhibitor was changed to an ARB.  Her cough is persistent. She denies fevers or  chills. She does tell me that she has a "lung problem" that she goes to [facility] for. She is on Spiriva and Proventil.  She likely has COPD.  Labs (9/14):  K 3.7, creatinine 0.59, ALT 13, TC 208, TG 277, HDL 48, LDL 105, Hgb 11.2  Wt Readings from Last 3 Encounters:  07/28/13 177 lb (80.287 kg)  07/08/13 180 lb 5.4 oz (81.8 kg)  07/08/13 180 lb 5.4 oz (81.8 kg)     Past Medical History  Diagnosis Date  . GERD (gastroesophageal reflux disease)   . Coronary artery disease     a. ant STEMI (8/14):  LHC - pLAD occluded (PCI: Promus premier 3x20 mm DES to), pOM1 40, pOM2 70-80 (small), pPDA 70-80, EF 35-40 ant HK, apex and inf AK;  b.  Re-look cath (9/14):  PLAD stent ok, D2 50-70 (small) => med Rx  . Atrial fibrillation     post MI => Amiodarone started and returned to NSR => plan to continue x 1 month (?d/c 08/2013)  . Ischemic cardiomyopathy   . Chronic systolic CHF (congestive heart failure)     echo 07/08/13: EF 35-40%, inferior, septal and apical HK.  Marland Kitchen PAD (peripheral artery disease)   . COPD (chronic obstructive pulmonary disease)     followed at [facility]  . Hyperlipidemia     Current Outpatient Prescriptions  Medication Sig Dispense Refill  . albuterol (PROVENTIL HFA;VENTOLIN HFA) 108 (90 BASE) MCG/ACT inhaler Inhale 2 puffs into the lungs every 6 (six) hours as needed for wheezing.      Marland Kitchen  amiodarone (PACERONE) 200 MG tablet Take 1 tablet (200 mg total) by mouth 2 (two) times daily.  60 tablet  0  . aspirin 81 MG chewable tablet Chew 1 tablet (81 mg total) by mouth daily.      Marland Kitchen atorvastatin (LIPITOR) 80 MG tablet Take 1 tablet (80 mg total) by mouth daily at 6 PM.  30 tablet  3  . benzonatate (TESSALON) 100 MG capsule as directed.      . carvedilol (COREG) 3.125 MG tablet Take 1 tablet (3.125 mg total) by mouth 2 (two) times daily with a meal.  60 tablet  3  . docusate sodium 100 MG CAPS Take 100 mg by mouth 2 (two) times daily.  10 capsule  0  . esomeprazole (NEXIUM) 40 MG capsule  Take 40 mg by mouth daily before breakfast.      . fluticasone (FLONASE) 50 MCG/ACT nasal spray Place 2 sprays into both nostrils daily as needed for allergies.      Marland Kitchen losartan (COZAAR) 25 MG tablet Take 1 tablet (25 mg total) by mouth daily.  30 tablet  6  . nitroGLYCERIN (NITROSTAT) 0.4 MG SL tablet Place 1 tablet (0.4 mg total) under the tongue every 5 (five) minutes as needed for chest pain.  25 tablet  3  . prasugrel (EFFIENT) 10 MG TABS tablet Take 1 tablet (10 mg total) by mouth daily.  30 tablet  11  . SPIRIVA HANDIHALER 18 MCG inhalation capsule as directed.      Marland Kitchen spironolactone (ALDACTONE) 25 MG tablet Take 0.5 tablets (12.5 mg total) by mouth daily.  20 tablet  3  . traMADol (ULTRAM) 50 MG tablet Take 1 tablet (50 mg total) by mouth every 6 (six) hours as needed.  20 tablet  0   No current facility-administered medications for this visit.    Allergies:    Allergies  Allergen Reactions  . Flagyl [Metronidazole] Other (See Comments)    unknown  . Penicillins Nausea And Vomiting and Rash    Social History:  The patient  reports that she has never smoked. She does not have any smokeless tobacco history on file. She reports that she does not drink alcohol.   ROS:  Please see the history of present illness.      All other systems reviewed and negative.   PHYSICAL EXAM: VS:  BP 126/70  Pulse 57  Ht 5' (1.524 m)  Wt 177 lb (80.287 kg)  BMI 34.57 kg/m2 Well nourished, well developed, in no acute distress HEENT: normal Neck: no JVD Cardiac:  normal S1, S2; RRR; no murmur; no S3 Lungs:  clear to auscultation bilaterally, no wheezing, rhonchi or rales Abd: soft, nontender, no hepatomegaly Ext: no edema; right wrist without hematoma or mass  Skin: warm and dry Neuro:  CNs 2-12 intact, no focal abnormalities noted  EKG:  Sinus bradycardia, HR 57, low voltage, septal Q waves with anterolateral T wave inversions      ASSESSMENT AND PLAN:  1. CAD: She is stable after recent  DES to the LAD in the setting of an anterior STEMI.  Continue aspirin and Effient. She has had some occasional chest pains. Question if these are related to her underlying COPD or possibly GERD. She denies any exertional chest discomfort. I have recommended increasing her Nexium to twice daily for 2 weeks. We will complete her paperwork to go to cardiac rehabilitation in [place]. Continue statin.  Check a basic metabolic panel and CBC today. 2. Cough: Possibly  related to ACE inhibitor. Question if this is related to GERD and COPD. Check a chest x-ray. I will also check a basic metabolic panel and BNP.  Adjust PPI as noted. I suppose it is remotely possible that carvedilol may be contributing to her cough. We could consider adjusting this in the future if needed. 3. Atrial Fibrillation: Maintaining NSR. She can stop her amiodarone after finishing her current bottle. 4. Chronic Systolic CHF: Continue current dose of losartan, carvedilol and spironolactone. She has noted some dyspnea with exertion as well as cough. On exam, she does not appear to be significantly volume overloaded. She did not require Lasix in the hospital.  I will check a basic metabolic panel and BNP today. If her BNP is elevated, I will place her on furosemide. 5. Ischemic Cardiomyopathy: Continue current therapy as noted above. We'll likely plan on repeat echocardiogram in 3 months post MI and revascularization to reassess LV function. If EF remains below 35%, consider referral to EP for consideration of ICD implantation. 6. Hyperlipidemia: Continue statin. Check lipids and LFTs in 6 weeks. 7. GERD: Adjust PPI as noted. 8. COPD: Continue current therapy. 9. PAD:  She follows with vascular surgery at [facility]. 10. Disposition: She prefers to continue to follow up with Dr. Swaziland here in [place]. I will arrange follow up in 4-6 weeks. She is also concerned about returning to her job due to increased amounts of stress. I have given her a  note to keep her out of work until seen back in follow up.  Signed, Tereso Newcomer, PA-C  07/28/2013 11:29 AM

## 2013-07-28 NOTE — Telephone Encounter (Signed)
no answer on both the home and cell #. I will try pt again tomorrow

## 2013-07-29 ENCOUNTER — Telehealth: Payer: Self-pay | Admitting: *Deleted

## 2013-07-29 DIAGNOSIS — I2589 Other forms of chronic ischemic heart disease: Secondary | ICD-10-CM

## 2013-07-29 DIAGNOSIS — I4891 Unspecified atrial fibrillation: Secondary | ICD-10-CM

## 2013-07-29 MED ORDER — POTASSIUM CHLORIDE ER 10 MEQ PO TBCR: 10.0000 meq | EXTENDED_RELEASE_TABLET | Freq: Every day | ORAL | Status: DC

## 2013-07-29 MED ORDER — FUROSEMIDE 20 MG PO TABS: 20.0000 mg | ORAL_TABLET | Freq: Every day | ORAL | Status: DC

## 2013-07-29 NOTE — Telephone Encounter (Signed)
pt notified about lab and cxr results with verbal understanding; pt aware to start lasix 20 mg qd, K+ 10 meq qd, bmet, bnp 08/04/13. Also pt notified that she will be credited for FLP/LFT that was done in error yesterday, to done in 6 wks.; pt verbalized understanding to all instructions today

## 2013-08-01 ENCOUNTER — Encounter: Payer: Self-pay | Admitting: *Deleted

## 2013-08-04 ENCOUNTER — Other Ambulatory Visit (INDEPENDENT_AMBULATORY_CARE_PROVIDER_SITE_OTHER): Payer: BC Managed Care – PPO

## 2013-08-04 ENCOUNTER — Other Ambulatory Visit: Payer: Self-pay | Admitting: *Deleted

## 2013-08-04 ENCOUNTER — Telehealth: Payer: Self-pay | Admitting: Physician Assistant

## 2013-08-04 DIAGNOSIS — I2589 Other forms of chronic ischemic heart disease: Secondary | ICD-10-CM

## 2013-08-04 DIAGNOSIS — R0602 Shortness of breath: Secondary | ICD-10-CM

## 2013-08-04 DIAGNOSIS — I4891 Unspecified atrial fibrillation: Secondary | ICD-10-CM

## 2013-08-04 LAB — BASIC METABOLIC PANEL
BUN: 13 mg/dL (ref 6–23)
CO2: 27 mEq/L (ref 19–32)
Calcium: 8.9 mg/dL (ref 8.4–10.5)
Creatinine, Ser: 0.8 mg/dL (ref 0.4–1.2)
Glucose, Bld: 117 mg/dL — ABNORMAL HIGH (ref 70–99)
Sodium: 138 mEq/L (ref 135–145)

## 2013-08-04 MED ORDER — FAMOTIDINE 20 MG PO TABS: 20.0000 mg | ORAL_TABLET | Freq: Every day | ORAL | Status: DC

## 2013-08-04 MED ORDER — ESOMEPRAZOLE MAGNESIUM 40 MG PO CPDR: 40.0000 mg | DELAYED_RELEASE_CAPSULE | Freq: Every day | ORAL | Status: DC

## 2013-08-04 NOTE — Telephone Encounter (Signed)
Walk In Pt Form " Letter Dropped Off" gave to Carol/Scott 08/04/13/KM

## 2013-08-04 NOTE — Telephone Encounter (Signed)
cb pt from her walk-in message this AM about her Wt's and has a cough still. Pt states she also brought in a letter from [place] [place] [facility] today w/the Wt's. I advised pt all I have are the Wt's no letter. I advised I will have the nurse who is working w/Scott W. PA tomorrow 9/26 to call [facility] and have letter faxed to [facility] tomorrow for review about her BP's. Pt states her BP was low the other day at rehab it was 87/58. Pt also then states she still has a cough, pt denies any fevers or chills. States took some mucinex cough syrup but did not take the tessalon pearles while taking the cough syrup, said she ran out of cough syrup and has taken the tessalon pearles for the last 2 days, but picked up more mucinex cough syrup today and will start back on that. I asked pt how her wt's have been and they have gone down and she is 175 today from 178 last Friday 07/29/13. I advised pt that I will send this to Bing Neighbors. PA for review and also we will try and get the letter from rehab about the BP's, and will have someone give her call tomorrow after Lorin Picket has had a chance to review my note from today. Pt verbalized understanding to Plan of Care

## 2013-08-04 NOTE — Telephone Encounter (Signed)
cb pt from her walk-in message this AM about her Wt's and has a cough still. Pt states she also brought in a letter from [place] [place] [facility] today w/the Wt's. I advised pt all I have are the Wt's no letter. I advised I will have the nurse who is working w/Scott W. PA tomorrow 9/26 to call [facility] and have letter faxed to Scott tomorrow for review about her BP's. Pt states her BP was low the other day at rehab it was 87/58. Pt also then states she still has a cough, pt denies any fevers or chills. States took some mucinex cough syrup but did not take the tessalon pearles while taking the cough syrup, said she ran out of cough syrup and has taken the tessalon pearles for the last 2 days, but picked up more mucinex cough syrup today and will start back on that. I asked pt how her wt's have been and they have gone down and she is 175 today from 178 last Friday 07/29/13. I advised pt that I will send this to Scott W. PA for review and also we will try and get the letter from rehab about the BP's, and will have someone give her call tomorrow after Scott has had a chance to review my note from today. Pt verbalized understanding to Plan of Care 

## 2013-08-05 ENCOUNTER — Other Ambulatory Visit: Payer: Self-pay

## 2013-08-05 ENCOUNTER — Telehealth: Payer: Self-pay

## 2013-08-05 ENCOUNTER — Other Ambulatory Visit: Payer: Self-pay | Admitting: Cardiology

## 2013-08-05 MED ORDER — ESOMEPRAZOLE MAGNESIUM 40 MG PO CPDR: 40.0000 mg | DELAYED_RELEASE_CAPSULE | Freq: Two times a day (BID) | ORAL | Status: DC

## 2013-08-05 MED ORDER — BISOPROLOL FUMARATE 5 MG PO TABS: 2.5000 mg | ORAL_TABLET | Freq: Every day | ORAL | Status: DC

## 2013-08-05 NOTE — Telephone Encounter (Signed)
If cough solely from ACE inhibitor (she was taken off of this), it may take some time to resolve completely (ie 4-6 weeks). Her cough may be from her lung problem (?COPD).  She should f/u with her pulmonologist as well. Make sure she is not having fevers or coughing up purulent sputum, hemoptysis, etc. Remotely possible Coreg may be causing a cough with underlying lung problems. Stop Coreg. Start Bisoprolol 2.5 mg QD. If BP running < 100 systolic, she can hold her Spironolactone until seen in follow up. Continue daily weights. Call if weight increases 2-3 lbs in one day. Tereso Newcomer, PA-C   08/05/2013 8:51 AM

## 2013-08-05 NOTE — Telephone Encounter (Addendum)
If cough solely from ACE inhibitor (she was taken off of this), it may take some time to resolve completely (ie 4-6 weeks).  Her cough may be from her lung problem (?COPD). She should f/u with her pulmonologist as well.  Have her scheduled follow up. Make sure she is not having fevers or coughing up purulent sputum, hemoptysis, etc.  Remotely possible Coreg may be causing a cough with underlying lung problems.  Stop Coreg.  Start Bisoprolol 2.5 mg QD.  Reviewed notes from rehab. Hold Spironolactone until seen in follow up.  If BP continues to run 100 systolic or less, hold Cozaar. Continue daily weights.  Call if weight increases 2-3 lbs in one day.  Tereso Newcomer, PA-C  08/05/2013 8:51 AM

## 2013-08-05 NOTE — Telephone Encounter (Signed)
Returned pt call. Pt c/c is a persistent cough. Pt denies fever or any sputum production,or hemoptysis. Pt denies chest pain and sob.Pt dvised per Scott Weaver, PA it could be caused by her copd and to f/u with her pulmonologist.  Pt advised of S.Weaver,PA other instructions Stop Coreg.  Start Bisoprolol 2.5 mg QD.  Hold Spironolactone until seen in follow up.  If BP continues to run 100 systolic or less, hold Cozaar. Continue daily weights.  Call if weight increases 2-3 lbs in one day.  Pt verbalized undestanding, and repeated instructions back to me.   

## 2013-08-05 NOTE — Telephone Encounter (Signed)
Returned pt call. Pt c/c is a persistent cough. Pt denies fever or any sputum production,or hemoptysis. Pt denies chest pain and sob.Pt dvised per Tereso Newcomer, PA it could be caused by her copd and to f/u with her pulmonologist.  Pt advised of S.Weaver,PA other instructions Stop Coreg.  Start Bisoprolol 2.5 mg QD.  Hold Spironolactone until seen in follow up.  If BP continues to run 100 systolic or less, hold Cozaar. Continue daily weights.  Call if weight increases 2-3 lbs in one day.  Pt verbalized undestanding, and repeated instructions back to me.

## 2013-08-05 NOTE — Telephone Encounter (Signed)
Spoke with patient who states she brought a paper from cardiac rehab yesterday with her BP and heart rate per cardiac rehab request.  Patient states she is calling to follow up on this.  Patient states she is feeling "fair" today.  Patient c/o discomfort in chest related to coughing and not being able to breath at night.  Patient denies sputum production, fever, SOB.  I advised patient that I would talk to Tereso Newcomer, PA-C who is in the office today and is the one who received the paperwork she dropped off yesterday and would call her back. Tereso Newcomer, PA-C advised that he has requested additional information from cardiac rehab and that [facility], CMA will call the patient with his advice before the end of the day.  I am routing message to [facility] for follow up.  I called and made patient aware and patient verbalized understanding.

## 2013-08-05 NOTE — Telephone Encounter (Signed)
New Problem:  Pt states she got paper work from her cardiac therapy... PT states she is concerned about her BP and heart rate that's noted on the documents. Pt states she would like to speak to the nurse. Pt states her BP was 87/58 on Wednesday. Pt would like to be advised.

## 2013-08-08 ENCOUNTER — Telehealth: Payer: Self-pay | Admitting: *Deleted

## 2013-08-08 NOTE — Telephone Encounter (Signed)
pt notified about her lab results also see phone note from 08/05/13 Nila Nephew. CMA made med changes per Bing Neighbors. PA

## 2013-09-08 ENCOUNTER — Ambulatory Visit (INDEPENDENT_AMBULATORY_CARE_PROVIDER_SITE_OTHER): Payer: BC Managed Care – PPO | Admitting: *Deleted

## 2013-09-08 DIAGNOSIS — R7309 Other abnormal glucose: Secondary | ICD-10-CM

## 2013-09-08 DIAGNOSIS — I739 Peripheral vascular disease, unspecified: Secondary | ICD-10-CM

## 2013-09-08 DIAGNOSIS — E785 Hyperlipidemia, unspecified: Secondary | ICD-10-CM

## 2013-09-08 DIAGNOSIS — I4891 Unspecified atrial fibrillation: Secondary | ICD-10-CM

## 2013-09-08 DIAGNOSIS — R739 Hyperglycemia, unspecified: Secondary | ICD-10-CM

## 2013-09-08 DIAGNOSIS — I2109 ST elevation (STEMI) myocardial infarction involving other coronary artery of anterior wall: Secondary | ICD-10-CM

## 2013-09-08 LAB — LIPID PANEL
LDL Cholesterol: 80 mg/dL (ref 0–99)
Total CHOL/HDL Ratio: 3
Triglycerides: 79 mg/dL (ref 0.0–149.0)

## 2013-09-27 ENCOUNTER — Telehealth: Payer: Self-pay | Admitting: Cardiology

## 2013-09-27 NOTE — Telephone Encounter (Signed)
New Problem  Pt called about Nitro's// She took 2 for discomfort in chest// she asks will it be ok to take more later if the problem persists.. Please call back to discuss.

## 2013-09-27 NOTE — Telephone Encounter (Signed)
Returned call to patient she stated she had chest discomfort,pain in throat yesterday, took NTG x 2 with relief.Stated she had chest discomfort again this morning and wanted to know if she can take more NTG.Patient was instructed on how to use NTG and she understood.Advised she can take NTG again if needed.Advised to keep appointment with Dr.Jordan Thursday 09/29/13.Advised to go to ER if needed.

## 2013-09-29 ENCOUNTER — Ambulatory Visit (INDEPENDENT_AMBULATORY_CARE_PROVIDER_SITE_OTHER): Payer: BC Managed Care – PPO | Admitting: Cardiology

## 2013-09-29 ENCOUNTER — Encounter: Payer: Self-pay | Admitting: Cardiology

## 2013-09-29 VITALS — BP 110/80 | HR 70 | Ht 60.0 in | Wt 176.0 lb

## 2013-09-29 DIAGNOSIS — I2589 Other forms of chronic ischemic heart disease: Secondary | ICD-10-CM

## 2013-09-29 DIAGNOSIS — I739 Peripheral vascular disease, unspecified: Secondary | ICD-10-CM

## 2013-09-29 DIAGNOSIS — E785 Hyperlipidemia, unspecified: Secondary | ICD-10-CM

## 2013-09-29 DIAGNOSIS — I255 Ischemic cardiomyopathy: Secondary | ICD-10-CM

## 2013-09-29 DIAGNOSIS — I5022 Chronic systolic (congestive) heart failure: Secondary | ICD-10-CM | POA: Insufficient documentation

## 2013-09-29 DIAGNOSIS — I509 Heart failure, unspecified: Secondary | ICD-10-CM

## 2013-09-29 DIAGNOSIS — I2510 Atherosclerotic heart disease of native coronary artery without angina pectoris: Secondary | ICD-10-CM

## 2013-09-29 NOTE — Patient Instructions (Signed)
We will schedule you for an echocardiogram  Continue your current medication  I will see you in 3 months.

## 2013-09-29 NOTE — Progress Notes (Signed)
Traci Choi Date of Birth: 08/05/1961 Medical Record #[id number]  History of Present Illness: Traci Choi is seen today for followup. She is a 52 year old white female with history of coronary disease, peripheral vascular disease, and COPD. She was admitted in August with an anterior STEMI. She had emergent stenting of the proximal LAD. She had no other significant disease. Ejection fraction was 35-40% at that time. Because of recurrent chest pain ECG changes she underwent repeat angiography during that hospitalization which showed continued stent patency. She did develop atrial fibrillation and was treated with amiodarone but this has since been discontinued. Since her hospitalization she has done fairly well from a cardiac standpoint. Since her hospital stay she has developed a bad nonproductive cough. She had followup with her pulmonologist at [facility] and was prescribed medication for her cough as well as a new inhaler. She is still coughing constantly and this is keeping her up at night. She's developed significant soreness in her lower sternum related to her cough. She sometimes has pain radiating across her upper chest. She has taken nitroglycerin on a couple occasions. She is going to cardiac rehabilitation therapy in [place]. Last week she was driving home from a session when she developed acute onset of shortness of breath. This was associated with some chest discomfort. She has previously worked as a Agricultural engineer. She found this job very stressful and is unsure that she would be able to go back to this job. His job required 12 hour shifts and alternating weeks of day and night coverage.  Current Outpatient Prescriptions on File Prior to Visit  Medication Sig Dispense Refill  . albuterol (PROVENTIL HFA;VENTOLIN HFA) 108 (90 BASE) MCG/ACT inhaler Inhale 2 puffs into the lungs every 6 (six) hours as needed for wheezing.      Marland Kitchen aspirin 81 MG chewable tablet Chew 1 tablet (81 mg total) by mouth  daily.      Marland Kitchen atorvastatin (LIPITOR) 80 MG tablet Take 1 tablet (80 mg total) by mouth daily at 6 PM.  30 tablet  3  . benzonatate (TESSALON) 100 MG capsule as directed.      . bisoprolol (ZEBETA) 5 MG tablet Take 0.5 tablets (2.5 mg total) by mouth daily.  30 tablet  2  . docusate sodium 100 MG CAPS Take 100 mg by mouth 2 (two) times daily.  10 capsule  0  . esomeprazole (NEXIUM) 40 MG capsule Take 1 capsule (40 mg total) by mouth 2 (two) times daily.      . fluticasone (FLONASE) 50 MCG/ACT nasal spray Place 2 sprays into both nostrils daily as needed for allergies.      . furosemide (LASIX) 20 MG tablet Take 1 tablet (20 mg total) by mouth daily.  30 tablet  11  . losartan (COZAAR) 25 MG tablet Take 1 tablet (25 mg total) by mouth daily.  30 tablet  6  . nitroGLYCERIN (NITROSTAT) 0.4 MG SL tablet Place 1 tablet (0.4 mg total) under the tongue every 5 (five) minutes as needed for chest pain.  25 tablet  3  . potassium chloride (K-DUR) 10 MEQ tablet Take 1 tablet (10 mEq total) by mouth daily.  30 tablet  11  . prasugrel (EFFIENT) 10 MG TABS tablet Take 1 tablet (10 mg total) by mouth daily.  30 tablet  11  . traMADol (ULTRAM) 50 MG tablet Take 1 tablet (50 mg total) by mouth every 6 (six) hours as needed.  20 tablet  0   No  current facility-administered medications on file prior to visit.    Allergies  Allergen Reactions  . Flagyl [Metronidazole] Other (See Comments)    unknown  . Penicillins Nausea And Vomiting and Rash    Past Medical History  Diagnosis Date  . GERD (gastroesophageal reflux disease)   . Coronary artery disease     a. ant STEMI (8/14):  LHC - pLAD occluded (PCI: Promus premier 3x20 mm DES to), pOM1 40, pOM2 70-80 (small), pPDA 70-80, EF 35-40 ant HK, apex and inf AK;  b.  Re-look cath (9/14):  PLAD stent ok, D2 50-70 (small) => med Rx  . Atrial fibrillation     post MI => Amiodarone started and returned to NSR => plan to continue x 1 month (?d/c 08/2013)  . Ischemic  cardiomyopathy   . Chronic systolic CHF (congestive heart failure)     echo 07/08/13: EF 35-40%, inferior, septal and apical HK.  Marland Kitchen PAD (peripheral artery disease)   . COPD (chronic obstructive pulmonary disease)     followed at [facility]  . Hyperlipidemia     Past Surgical History  Procedure Laterality Date  . Abdominal aorta stent      History  Smoking status  . Never Smoker   Smokeless tobacco  . Not on file    History  Alcohol Use No    History reviewed. No pertinent family history.  Review of Systems: As noted in history of present illness.  All other systems were reviewed and are negative.  Physical Exam: BP 110/80  Pulse 70  Ht 5' (1.524 m)  Wt 176 lb (79.833 kg)  BMI 34.37 kg/m2 She is a pleasant black female in no acute distress. HEENT: Normal Neck: No JVD or bruits. No adenopathy or thyromegaly. Lungs: Mild expiratory wheezes. Cardiovascular: Regular rate and rhythm. Normal S1-S2. No gallop, murmur, or click. Abdomen: Soft, mildly obese, no organomegaly or masses. Extremities: No cyanosis or edema. Pulses are 2+ and symmetric. Skin: Warm and dry Neuro: Alert and oriented x3. Cranial nerves II through XII are intact.  LABORATORY DATA: ECG today demonstrates normal sinus rhythm with old septal infarct. There are T-wave inversions in leads V1-2. No acute changes noted.  Assessment / Plan: 1. Coronary disease status post anterior STEMI. Status post stenting of the proximal LAD with drug-eluting stent. Next continue dual antiplatelet therapy for at least one year. Her current chest pain symptoms are more consistent with musculoskeletal chest pain and are reproduced on exam and with coughing.  2. Ischemic cardiomyopathy. Prior ejection fraction 35-40%. We'll repeat an echocardiogram at this time.  3. Atrial fibrillation. No recurrence. She is no longer on amiodarone.  4. Chronic cough. She is no longer on an ACE inhibitor. Continue therapy per pulmonary. I  instructed the patient of this does not improve she should follow up with pulmonary center.  5. Chronic systolic CHF. Well compensated. Continue losartan and carvedilol. Patient is not taking spironolactone at this time. It is unclear when this was stopped. She does not appear to be volume overloaded.  6. PAD. Status post abdominal aortic stenting in the past. Next  7. Disposition. I feel that in her current state she is not able to return to her current stressful environment. I do not feel that she can tolerate 12 hour shifts. Patient is going to look into disability. I recommended she stay of work until the first of the year.

## 2013-10-27 ENCOUNTER — Ambulatory Visit (HOSPITAL_COMMUNITY): Payer: BC Managed Care – PPO | Attending: Cardiology | Admitting: Radiology

## 2013-10-27 ENCOUNTER — Other Ambulatory Visit: Payer: Self-pay

## 2013-10-27 DIAGNOSIS — I2510 Atherosclerotic heart disease of native coronary artery without angina pectoris: Secondary | ICD-10-CM

## 2013-10-27 DIAGNOSIS — I255 Ischemic cardiomyopathy: Secondary | ICD-10-CM

## 2013-10-27 DIAGNOSIS — I5022 Chronic systolic (congestive) heart failure: Secondary | ICD-10-CM

## 2013-10-27 DIAGNOSIS — I4891 Unspecified atrial fibrillation: Secondary | ICD-10-CM | POA: Insufficient documentation

## 2013-10-27 DIAGNOSIS — I2589 Other forms of chronic ischemic heart disease: Secondary | ICD-10-CM

## 2013-10-27 DIAGNOSIS — I739 Peripheral vascular disease, unspecified: Secondary | ICD-10-CM

## 2013-10-27 DIAGNOSIS — E785 Hyperlipidemia, unspecified: Secondary | ICD-10-CM | POA: Insufficient documentation

## 2013-10-27 DIAGNOSIS — I509 Heart failure, unspecified: Secondary | ICD-10-CM | POA: Insufficient documentation

## 2013-10-27 NOTE — Progress Notes (Signed)
Echocardiogram performed.  

## 2013-10-28 ENCOUNTER — Telehealth: Payer: Self-pay

## 2013-10-28 NOTE — Telephone Encounter (Signed)
Patient called no answer.Left message for patient to return my call regarding letter she needed to return to work.

## 2013-11-01 ENCOUNTER — Encounter: Payer: Self-pay | Admitting: *Deleted

## 2013-11-01 ENCOUNTER — Telehealth: Payer: Self-pay | Admitting: Cardiology

## 2013-11-01 NOTE — Telephone Encounter (Signed)
Left message for patient to call back  

## 2013-11-01 NOTE — Telephone Encounter (Signed)
New message  Pt called about not being able to go back to work. She states that nurse Elnita Maxwell would have everything on file and declined giving any additional information. She ask's that you please check the files for any updates. Pt requests a call back to discuss.

## 2013-11-07 NOTE — Telephone Encounter (Signed)
Returned call to patient she stated she needed a note to remain out of work until 11/24/13.Stated she is not ready to work 12 hours.Dr.Jordan advised to see if work will let her work 8 hours and if they want she will need to file for disability.Stated her work will not let her work 8 hours and she will be filing for disability.

## 2013-11-07 NOTE — Telephone Encounter (Signed)
Follow Up   Pt returned call// requests a call back as soon as possible. If you cannot reach her at the number provided please call [phone].

## 2013-11-14 ENCOUNTER — Telehealth: Payer: Self-pay | Admitting: Cardiology

## 2013-11-14 ENCOUNTER — Other Ambulatory Visit: Payer: Self-pay | Admitting: Physician Assistant

## 2013-11-14 NOTE — Telephone Encounter (Signed)
Returned [facility] to [facility],person that answered phone said to hold on,stayed on hold for 10 min patient never answered phone.

## 2013-11-14 NOTE — Telephone Encounter (Signed)
New problem    Status of disability papers  - work

## 2013-11-15 NOTE — Telephone Encounter (Signed)
Pt informed/ dr/nurse not in today and will be in tomorrow. Pt is requesting a call in the morning please.

## 2013-11-15 NOTE — Telephone Encounter (Signed)
Follow Up  Pt returned call about disability forms// Please call.

## 2013-11-16 NOTE — Telephone Encounter (Signed)
Patient is calling regarding disability letter that you were writing for her, please call and advise.

## 2013-11-16 NOTE — Telephone Encounter (Signed)
Returned call to patient she stated she needs a letter for work stating she is unable to work 12 hour shifts.Stated she wants letter faxed to her at fax# [phone].

## 2013-11-17 ENCOUNTER — Telehealth: Payer: Self-pay | Admitting: Cardiology

## 2013-11-17 NOTE — Telephone Encounter (Signed)
Walk In pt Form " [employer]" paper work Dropped Off sent to [employer]

## 2013-11-18 ENCOUNTER — Telehealth: Payer: Self-pay | Admitting: Cardiology

## 2013-11-18 NOTE — Telephone Encounter (Signed)
New message     Reminding Traci Choi to fax disability papers ASAP---fax [phone]

## 2013-11-18 NOTE — Telephone Encounter (Signed)
Returned call to patient will have Dr.Jordan sign letter stating unable to work 12 hour shifts on Monday 11/21/13.

## 2013-11-21 ENCOUNTER — Telehealth: Payer: Self-pay | Admitting: Cardiology

## 2013-11-21 NOTE — Telephone Encounter (Signed)
Returned call to patient letter stating unable to work 12 hours faxed to fax # [phone].Patient called she did receive letter.

## 2013-11-21 NOTE — Telephone Encounter (Signed)
Follow Up    Pt calling in to follow up on faxed information she requested.

## 2013-11-21 NOTE — Telephone Encounter (Signed)
New message     Don't forget to fax pt her disability papers--[phone]

## 2013-11-21 NOTE — Telephone Encounter (Signed)
See previous 09/11/14 note. 

## 2013-12-10 ENCOUNTER — Other Ambulatory Visit: Payer: Self-pay | Admitting: Physician Assistant

## 2014-01-02 ENCOUNTER — Encounter: Payer: Self-pay | Admitting: Cardiology

## 2014-01-02 ENCOUNTER — Ambulatory Visit (INDEPENDENT_AMBULATORY_CARE_PROVIDER_SITE_OTHER): Payer: BC Managed Care – PPO | Admitting: Cardiology

## 2014-01-02 ENCOUNTER — Encounter (INDEPENDENT_AMBULATORY_CARE_PROVIDER_SITE_OTHER): Payer: Self-pay

## 2014-01-02 ENCOUNTER — Other Ambulatory Visit: Payer: Self-pay

## 2014-01-02 VITALS — BP 132/78 | HR 61 | Ht 60.0 in | Wt 179.4 lb

## 2014-01-02 DIAGNOSIS — I4891 Unspecified atrial fibrillation: Secondary | ICD-10-CM

## 2014-01-02 DIAGNOSIS — I5022 Chronic systolic (congestive) heart failure: Secondary | ICD-10-CM

## 2014-01-02 DIAGNOSIS — E785 Hyperlipidemia, unspecified: Secondary | ICD-10-CM

## 2014-01-02 DIAGNOSIS — I255 Ischemic cardiomyopathy: Secondary | ICD-10-CM

## 2014-01-02 DIAGNOSIS — I2510 Atherosclerotic heart disease of native coronary artery without angina pectoris: Secondary | ICD-10-CM

## 2014-01-02 DIAGNOSIS — I2589 Other forms of chronic ischemic heart disease: Secondary | ICD-10-CM

## 2014-01-02 MED ORDER — ATORVASTATIN CALCIUM 40 MG PO TABS: 40.0000 mg | ORAL_TABLET | Freq: Every day | ORAL | Status: DC

## 2014-01-02 NOTE — Patient Instructions (Signed)
Reduce atorvastatin (lipitor) to 40 mg daily.   Continue your other therapy  I will see you in 6 months with fasting lab work

## 2014-01-02 NOTE — Progress Notes (Signed)
Traci Choi Date of Birth: [birth year] Medical Record #[id number]  History of Present Illness: Traci Choi is seen today for followup. Traci Choi has a history of coronary disease, peripheral vascular disease, and COPD. Traci Choi was admitted in August 2014 with an anterior STEMI. Traci Choi had emergent stenting of the proximal LAD. Traci Choi had no other significant disease. Ejection fraction was 35-40% at that time. Because of recurrent chest pain ECG changes Traci Choi underwent repeat angiography during that hospitalization which showed continued stent patency. Traci Choi did develop atrial fibrillation and was treated with amiodarone but this has since been discontinued. Since her hospitalization Traci Choi has done fairly well from a cardiac standpoint.  Traci Choi denies any chest pain but does note some pressure in her chest at night. Traci Choi still has a persistent cough but it has improved. Her chest wall is less sore. Traci Choi is applying for disability.  Current Outpatient Prescriptions on File Prior to Visit  Medication Sig Dispense Refill  . albuterol (PROVENTIL HFA;VENTOLIN HFA) 108 (90 BASE) MCG/ACT inhaler Inhale 2 puffs into the lungs every 6 (six) hours as needed for wheezing.      Marland Kitchen aspirin 81 MG chewable tablet Chew 1 tablet (81 mg total) by mouth daily.      . benzonatate (TESSALON) 100 MG capsule as directed.      . bisoprolol (ZEBETA) 5 MG tablet Take 0.5 tablets (2.5 mg total) by mouth daily.  30 tablet  2  . chlorpheniramine-HYDROcodone (TUSSIONEX) 10-8 MG/5ML LQCR       . esomeprazole (NEXIUM) 40 MG capsule Take 1 capsule (40 mg total) by mouth 2 (two) times daily.      . fluticasone (FLONASE) 50 MCG/ACT nasal spray Place 2 sprays into both nostrils daily as needed for allergies.      . fluticasone-salmeterol (ADVAIR HFA) 45-21 MCG/ACT inhaler Inhale 2 puffs into the lungs 2 (two) times daily.      . furosemide (LASIX) 20 MG tablet Take 1 tablet (20 mg total) by mouth daily.  30 tablet  11  . losartan (COZAAR) 25 MG tablet Take 1  tablet (25 mg total) by mouth daily.  30 tablet  6  . nitroGLYCERIN (NITROSTAT) 0.4 MG SL tablet Place 1 tablet (0.4 mg total) under the tongue every 5 (five) minutes as needed for chest pain.  25 tablet  3  . potassium chloride (K-DUR) 10 MEQ tablet Take 1 tablet (10 mEq total) by mouth daily.  30 tablet  11  . prasugrel (EFFIENT) 10 MG TABS tablet Take 1 tablet (10 mg total) by mouth daily.  30 tablet  11  . traMADol (ULTRAM) 50 MG tablet Take 1 tablet (50 mg total) by mouth every 6 (six) hours as needed.  20 tablet  0   No current facility-administered medications on file prior to visit.    Allergies  Allergen Reactions  . Flagyl [Metronidazole] Other (See Comments)    unknown  . Penicillins Nausea And Vomiting and Rash    Past Medical History  Diagnosis Date  . GERD (gastroesophageal reflux disease)   . Coronary artery disease     a. ant STEMI (8/14):  LHC - pLAD occluded (PCI: Promus premier 3x20 mm DES to), pOM1 40, pOM2 70-80 (small), pPDA 70-80, EF 35-40 ant HK, apex and inf AK;  b.  Re-look cath (9/14):  PLAD stent ok, D2 50-70 (small) => med Rx  . Atrial fibrillation     post MI => Amiodarone started and returned to NSR => plan to continue  x 1 month (?d/c 08/2013)  . Ischemic cardiomyopathy   . Chronic systolic CHF (congestive heart failure)     echo 07/08/13: EF 35-40%, inferior, septal and apical HK.  Marland Kitchen. PAD (peripheral artery disease)   . COPD (chronic obstructive pulmonary disease)     [facility]  . Hyperlipidemia     Past Surgical History  Procedure Laterality Date  . Abdominal aorta stent      History  Smoking status  . Never Smoker   Smokeless tobacco  . Not on file    History  Alcohol Use No    History reviewed. No pertinent family history.  Review of Systems: As noted in history of present illness.  All other systems were reviewed and are negative.  Physical Exam: BP 132/78  Pulse 61  Ht 5' (1.524 m)  Wt 179 lb 6.4 oz (81.375 kg)  BMI  35.04 kg/m2 Traci Choi is a pleasant black female in no acute distress. HEENT: Normal Neck: No JVD or bruits. No adenopathy or thyromegaly. Lungs: Mild expiratory wheezes. Cardiovascular: Regular rate and rhythm. Normal S1-S2. No gallop, murmur, or click. Abdomen: Soft, mildly obese, no organomegaly or masses. Extremities: No cyanosis or edema. Pulses are 2+ and symmetric. Skin: Warm and dry Neuro: Alert and oriented x3. Cranial nerves II through XII are intact.  LABORATORY DATA: Echo: 10/26/14: Study Conclusions  - Left ventricle: The cavity size was normal. Wall thickness was normal. Systolic function was mildly to moderately reduced. The estimated ejection fraction was in the range of 40% to 45%. There is hypokinesis of the anteroseptal and apical myocardium. Features are consistent with a pseudonormal left ventricular filling pattern, with concomitant abnormal relaxation and increased filling pressure (grade 2 diastolic dysfunction). - Mitral valve: Mild regurgitation. Impressions:  - Compared to studyof 8/14, LV function is slightly improved; redundant MV chordae noted.    Assessment / Plan: 1. Coronary disease status post anterior STEMI. Status post stenting of the proximal LAD with drug-eluting stent. Continue dual antiplatelet therapy for at least one year.   2. Ischemic cardiomyopathy. EF improved slightly to 40-45%/  3. Atrial fibrillation. No recurrence. Traci Choi is no longer on amiodarone.  4. Chronic cough. Continue therapy per pulmonary.  5. Chronic systolic CHF. Well compensated. Continue losartan and carvedilol.  Traci Choi does not appear to be volume overloaded.  6. PAD. Status post abdominal aortic stenting in the past.   I will follow up in 6 months with fasting lab work.

## 2014-02-01 ENCOUNTER — Other Ambulatory Visit: Payer: Self-pay

## 2014-02-01 MED ORDER — BISOPROLOL FUMARATE 5 MG PO TABS: 2.5000 mg | ORAL_TABLET | Freq: Every day | ORAL | Status: DC

## 2014-03-01 ENCOUNTER — Other Ambulatory Visit: Payer: Self-pay

## 2014-03-01 MED ORDER — LOSARTAN POTASSIUM 25 MG PO TABS: 25.0000 mg | ORAL_TABLET | Freq: Every day | ORAL | Status: DC

## 2014-05-25 ENCOUNTER — Other Ambulatory Visit: Payer: Self-pay

## 2014-05-25 MED ORDER — ATORVASTATIN CALCIUM 40 MG PO TABS: 40.0000 mg | ORAL_TABLET | Freq: Every day | ORAL | Status: DC

## 2014-05-30 ENCOUNTER — Telehealth: Payer: Self-pay | Admitting: Cardiology

## 2014-05-30 NOTE — Telephone Encounter (Signed)
Walk in pt Form " [employer] x2" forms Dropped off at [employer] these were Sent Interoffice to [employer] [address] [facility]  For Dr.Jordan To Complete. 7.21.15/km

## 2014-05-31 ENCOUNTER — Telehealth: Payer: Self-pay | Admitting: Cardiology

## 2014-05-31 NOTE — Telephone Encounter (Signed)
LMOVM For Pt to Return my Call need to let her know sent her [employer] paperwork over to [employer] For Dr.Jordan to Complete. 7.22.15/km

## 2014-06-05 ENCOUNTER — Telehealth: Payer: Self-pay | Admitting: Cardiology

## 2014-06-05 NOTE — Telephone Encounter (Signed)
Returned call to patient she stated she needed disability form completed and signed by Dr.Jordan.Stated she will refax form to me.Stated form needs to be completed by 06/09/14.

## 2014-06-05 NOTE — Telephone Encounter (Signed)
She wants to know if you received any disabilty papers that she left her last week?Please call her asap.

## 2014-06-06 NOTE — Telephone Encounter (Signed)
Forward to [facility]

## 2014-06-06 NOTE — Telephone Encounter (Signed)
Needs to talk about disability forms.

## 2014-06-09 NOTE — Telephone Encounter (Signed)
Spoke to patient 06/07/14 Dr.Jordan signed disability form.Form faxed to [employer] [phone] and a copy faxed to patient at fax # [phone].

## 2014-07-11 ENCOUNTER — Other Ambulatory Visit: Payer: Self-pay

## 2014-07-11 MED ORDER — PRASUGREL HCL 10 MG PO TABS: 10.0000 mg | ORAL_TABLET | Freq: Every day | ORAL | Status: DC

## 2014-07-11 MED ORDER — ESOMEPRAZOLE MAGNESIUM 40 MG PO CPDR: 40.0000 mg | DELAYED_RELEASE_CAPSULE | Freq: Two times a day (BID) | ORAL | Status: DC

## 2014-07-25 ENCOUNTER — Other Ambulatory Visit: Payer: Self-pay | Admitting: *Deleted

## 2014-07-25 DIAGNOSIS — I2589 Other forms of chronic ischemic heart disease: Secondary | ICD-10-CM

## 2014-07-25 MED ORDER — POTASSIUM CHLORIDE ER 10 MEQ PO TBCR: 10.0000 meq | EXTENDED_RELEASE_TABLET | Freq: Every day | ORAL | Status: DC

## 2014-07-25 MED ORDER — BISOPROLOL FUMARATE 5 MG PO TABS: 2.5000 mg | ORAL_TABLET | Freq: Every day | ORAL | Status: DC

## 2014-07-27 ENCOUNTER — Other Ambulatory Visit: Payer: Self-pay | Admitting: *Deleted

## 2014-07-27 DIAGNOSIS — I2589 Other forms of chronic ischemic heart disease: Secondary | ICD-10-CM

## 2014-07-27 MED ORDER — FUROSEMIDE 20 MG PO TABS: 20.0000 mg | ORAL_TABLET | Freq: Every day | ORAL | Status: DC

## 2014-08-08 ENCOUNTER — Encounter: Payer: Self-pay | Admitting: Cardiology

## 2014-08-08 ENCOUNTER — Ambulatory Visit (INDEPENDENT_AMBULATORY_CARE_PROVIDER_SITE_OTHER): Payer: BC Managed Care – PPO | Admitting: Cardiology

## 2014-08-08 VITALS — BP 110/60 | HR 82 | Ht 60.0 in | Wt 177.4 lb

## 2014-08-08 DIAGNOSIS — I509 Heart failure, unspecified: Secondary | ICD-10-CM

## 2014-08-08 DIAGNOSIS — I25119 Atherosclerotic heart disease of native coronary artery with unspecified angina pectoris: Secondary | ICD-10-CM

## 2014-08-08 DIAGNOSIS — I739 Peripheral vascular disease, unspecified: Secondary | ICD-10-CM

## 2014-08-08 DIAGNOSIS — I2510 Atherosclerotic heart disease of native coronary artery without angina pectoris: Secondary | ICD-10-CM

## 2014-08-08 DIAGNOSIS — I209 Angina pectoris, unspecified: Secondary | ICD-10-CM

## 2014-08-08 DIAGNOSIS — I5022 Chronic systolic (congestive) heart failure: Secondary | ICD-10-CM

## 2014-08-08 DIAGNOSIS — I2589 Other forms of chronic ischemic heart disease: Secondary | ICD-10-CM

## 2014-08-08 DIAGNOSIS — I255 Ischemic cardiomyopathy: Secondary | ICD-10-CM

## 2014-08-08 MED ORDER — BISOPROLOL FUMARATE 5 MG PO TABS: 2.5000 mg | ORAL_TABLET | Freq: Every day | ORAL | Status: DC

## 2014-08-08 MED ORDER — LOSARTAN POTASSIUM 25 MG PO TABS: 25.0000 mg | ORAL_TABLET | Freq: Every day | ORAL | Status: DC

## 2014-08-08 MED ORDER — POTASSIUM CHLORIDE ER 10 MEQ PO TBCR: 10.0000 meq | EXTENDED_RELEASE_TABLET | Freq: Every day | ORAL | Status: DC

## 2014-08-08 MED ORDER — FUROSEMIDE 20 MG PO TABS
20.0000 mg | ORAL_TABLET | Freq: Every day | ORAL | Status: DC
Start: 2014-08-08 — End: 2016-02-14

## 2014-08-08 NOTE — Progress Notes (Signed)
Traci Choi Date of Birth: 11/30/1960 Medical Record #[id number]  History of Present Illness: [name] is seen today for followup. She has a history of coronary disease, peripheral vascular disease, and COPD. She was admitted in August 2014 with an anterior STEMI. She had emergent stenting of the proximal LAD with DES. She had no other significant disease. Ejection fraction was 35-40% at that time. Because of recurrent chest pain ECG changes she underwent repeat angiography during that hospitalization which showed continued stent patency. She did develop atrial fibrillation and was treated with amiodarone but this has since been discontinued.  On follow up today she states she is doing well. She denies any chest pain with exertion but does note some pressure/pain in her chest at night. This is associated with reflux and cough. She does complain of some dyspnea on exertion. She is no longer smoking. She reports her memory is poor.   Current Outpatient Prescriptions on File Prior to Visit  Medication Sig Dispense Refill  . albuterol (PROVENTIL HFA;VENTOLIN HFA) 108 (90 BASE) MCG/ACT inhaler Inhale 2 puffs into the lungs every 6 (six) hours as needed for wheezing.      Marland Kitchen. aspirin 81 MG chewable tablet Chew 1 tablet (81 mg total) by mouth daily.      Marland Kitchen. atorvastatin (LIPITOR) 40 MG tablet Take 1 tablet (40 mg total) by mouth daily.  90 tablet  3  . benzonatate (TESSALON) 100 MG capsule as directed.      . chlorpheniramine-HYDROcodone (TUSSIONEX) 10-8 MG/5ML LQCR       . fluticasone (FLONASE) 50 MCG/ACT nasal spray Place 2 sprays into both nostrils daily as needed for allergies.      . fluticasone-salmeterol (ADVAIR HFA) 45-21 MCG/ACT inhaler Inhale 2 puffs into the lungs 2 (two) times daily.      . nitroGLYCERIN (NITROSTAT) 0.4 MG SL tablet Place 1 tablet (0.4 mg total) under the tongue every 5 (five) minutes as needed for chest pain.  25 tablet  3  . traMADol (ULTRAM) 50 MG tablet Take 1 tablet (50  mg total) by mouth every 6 (six) hours as needed.  20 tablet  0   No current facility-administered medications on file prior to visit.    Allergies  Allergen Reactions  . Flagyl [Metronidazole] Other (See Comments)    unknown  . Penicillins Nausea And Vomiting and Rash    Past Medical History  Diagnosis Date  . GERD (gastroesophageal reflux disease)   . Coronary artery disease     a. ant STEMI (8/14):  LHC - pLAD occluded (PCI: Promus premier 3x20 mm DES to), pOM1 40, pOM2 70-80 (small), pPDA 70-80, EF 35-40 ant HK, apex and inf AK;  b.  Re-look cath (9/14):  PLAD stent ok, D2 50-70 (small) => med Rx  . Atrial fibrillation     post MI => Amiodarone started and returned to NSR => plan to continue x 1 month (?d/c 08/2013)  . Ischemic cardiomyopathy   . Chronic systolic CHF (congestive heart failure)     echo 07/08/13: EF 35-40%, inferior, septal and apical HK.  Marland Kitchen. PAD (peripheral artery disease)   . COPD (chronic obstructive pulmonary disease)     [facility]  . Hyperlipidemia     Past Surgical History  Procedure Laterality Date  . Abdominal aorta stent      History  Smoking status  . Never Smoker   Smokeless tobacco  . Not on file    History  Alcohol Use No  History reviewed. No pertinent family history.  Review of Systems: As noted in history of present illness.  All other systems were reviewed and are negative.  Physical Exam: BP 110/60  Pulse 82  Ht 5' (1.524 m)  Wt 177 lb 6.4 oz (80.468 kg)  BMI 34.65 kg/m2 She is a pleasant black female in no acute distress. HEENT: Normal Neck: No JVD or bruits. No adenopathy or thyromegaly. Lungs: Clear Cardiovascular: Regular rate and rhythm. Normal S1-S2. No gallop, murmur, or click. Abdomen: Soft, mildly obese, no organomegaly or masses. Extremities: No cyanosis or edema. Pulses are 2+ and symmetric. Skin: Warm and dry Neuro: Alert and oriented x3. Cranial nerves II through XII are intact.  LABORATORY  DATA:   Assessment / Plan: 1. Coronary disease status post anterior STEMI. Status post stenting of the proximal LAD with drug-eluting stent. Continue ASA. She may stop Effient now.    2. Ischemic cardiomyopathy. EF improved slightly to 40-45%. Will check BNP. I suspect her dyspnea is more related to COPD and deconditioning.   3. Atrial fibrillation. No recurrence.   4. Chronic cough. Continue therapy per pulmonary.  5. Chronic systolic CHF. Well compensated. Continue losartan and carvedilol.  She does not appear to be volume overloaded.  6. PAD. Status post abdominal aortic stenting in the past.   We will schedule fasting lab work with chemistries, lipids, and BNP Otherwise I will follow up in 6 months.

## 2014-08-08 NOTE — Patient Instructions (Signed)
You can stop Effient (prasugrel)  Continue your other therapy  We will schedule you for lab work  I will see you in 6 months.

## 2014-08-15 ENCOUNTER — Telehealth: Payer: Self-pay | Admitting: Cardiology

## 2014-08-15 LAB — HEPATIC FUNCTION PANEL
ALK PHOS: 74 U/L (ref 39–117)
ALT: 18 U/L (ref 0–35)
AST: 18 U/L (ref 0–37)
Albumin: 3.9 g/dL (ref 3.5–5.2)
BILIRUBIN INDIRECT: 0.4 mg/dL (ref 0.2–1.2)
Bilirubin, Direct: 0.1 mg/dL (ref 0.0–0.3)
Total Bilirubin: 0.5 mg/dL (ref 0.2–1.2)
Total Protein: 7.2 g/dL (ref 6.0–8.3)

## 2014-08-15 LAB — LIPID PANEL
CHOLESTEROL: 145 mg/dL (ref 0–200)
HDL: 49 mg/dL (ref >39)
LDL Cholesterol: 85 mg/dL (ref 0–99)
Total CHOL/HDL Ratio: 3 Ratio
Triglycerides: 56 mg/dL (ref <150)
VLDL: 11 mg/dL (ref 0–40)

## 2014-08-15 LAB — BASIC METABOLIC PANEL
BUN: 14 mg/dL (ref 6–23)
CO2: 26 mEq/L (ref 19–32)
CREATININE: 0.58 mg/dL (ref 0.50–1.10)
Calcium: 9 mg/dL (ref 8.4–10.5)
Chloride: 107 mEq/L (ref 96–112)
Glucose, Bld: 93 mg/dL (ref 70–99)
Potassium: 4.7 mEq/L (ref 3.5–5.3)
Sodium: 142 mEq/L (ref 135–145)

## 2014-08-15 LAB — BRAIN NATRIURETIC PEPTIDE: Brain Natriuretic Peptide: 68 pg/mL (ref 0.0–100.0)

## 2014-08-15 NOTE — Telephone Encounter (Signed)
Returned call to patient lab results given. 

## 2014-08-15 NOTE — Telephone Encounter (Signed)
Returning your call. °

## 2014-10-19 ENCOUNTER — Encounter (HOSPITAL_COMMUNITY): Payer: Self-pay | Admitting: Cardiology

## 2014-12-06 ENCOUNTER — Telehealth: Payer: Self-pay | Admitting: Cardiology

## 2014-12-06 NOTE — Telephone Encounter (Signed)
Returned call to patient she stated she needed a disability form filled out by the end of this month.Advised to fax form to [phone] and I will let her know if I can fill out form.

## 2014-12-06 NOTE — Telephone Encounter (Signed)
Pt called in wanting to speak with Traci Choi when she has a free moment. She would not discuss with me what she wanted to talk to Traci Choi about when I asked. Please call  Thanks

## 2014-12-07 ENCOUNTER — Telehealth: Payer: Self-pay | Admitting: Cardiology

## 2014-12-07 NOTE — Telephone Encounter (Signed)
Pt wants to be sure you received her fax.

## 2014-12-07 NOTE — Telephone Encounter (Signed)
Returned call to patient received form will have Dr.Jordan sign and fax.

## 2014-12-08 NOTE — Telephone Encounter (Signed)
[employer] form completed and faxed to fax # [phone].Copy also faxed to patient at fax # [phone].

## 2014-12-08 NOTE — Telephone Encounter (Signed)
See previous 12/08/14 note.

## 2015-01-22 ENCOUNTER — Other Ambulatory Visit: Payer: Self-pay

## 2015-01-22 MED ORDER — ESOMEPRAZOLE MAGNESIUM 40 MG PO CPDR: 40.0000 mg | DELAYED_RELEASE_CAPSULE | Freq: Two times a day (BID) | ORAL | Status: DC

## 2015-01-30 ENCOUNTER — Telehealth: Payer: Self-pay

## 2015-01-30 NOTE — Telephone Encounter (Signed)
Patient called no answer.Left message on personal voice mail to call me.Received a message needs a prior authorization for nexium.

## 2015-02-02 ENCOUNTER — Ambulatory Visit: Payer: BC Managed Care – PPO | Admitting: Cardiology

## 2015-02-05 ENCOUNTER — Telehealth: Payer: Self-pay

## 2015-02-05 MED ORDER — PANTOPRAZOLE SODIUM 40 MG PO TBEC: 40.0000 mg | DELAYED_RELEASE_TABLET | Freq: Every day | ORAL | Status: DC

## 2015-02-05 NOTE — Telephone Encounter (Signed)
Spoke to patient insurance will no longer cover nexium.Stated she has took omeprazole in the past did not help.Spoke to Dr.Jordan he advised take protonix 40 mg daily.Prescription sent to pharmacy.

## 2015-02-07 ENCOUNTER — Telehealth: Payer: Self-pay | Admitting: Cardiology

## 2015-02-07 NOTE — Telephone Encounter (Signed)
Returned call to patient she stated she needed to cancel appointment with Dr.Jordan 02/08/15.Appointment cancelled and rescheduled to 03/12/15.Stated she wanted to let Dr.Jordan know she has increase swelling in both lower legs and feet.No sob.Weight gain.Advised will let Dr.Jordan know and call her back.

## 2015-02-07 NOTE — Telephone Encounter (Signed)
Pt called in stating that she will need to r/s her appt with Dr. [place] tomorrow but wanted to see if she could be worked in somewhere next week to where she didn't have to wait until June to see him. Please call   Thanks

## 2015-02-08 ENCOUNTER — Ambulatory Visit: Payer: Self-pay | Admitting: Cardiology

## 2015-02-08 NOTE — Telephone Encounter (Signed)
Returned call to patient 02/07/15.Dr.Jordan advised ok to increase lasix to 40 mg daily for the next 3 to 4 days then return to usual dose.Advised to call back if continues to have swelling.

## 2015-03-12 ENCOUNTER — Ambulatory Visit (INDEPENDENT_AMBULATORY_CARE_PROVIDER_SITE_OTHER): Payer: BLUE CROSS/BLUE SHIELD | Admitting: Cardiology

## 2015-03-12 ENCOUNTER — Encounter: Payer: Self-pay | Admitting: Cardiology

## 2015-03-12 VITALS — BP 110/74 | HR 55 | Ht 60.0 in | Wt 181.0 lb

## 2015-03-12 DIAGNOSIS — I255 Ischemic cardiomyopathy: Secondary | ICD-10-CM

## 2015-03-12 DIAGNOSIS — I25119 Atherosclerotic heart disease of native coronary artery with unspecified angina pectoris: Secondary | ICD-10-CM

## 2015-03-12 DIAGNOSIS — I5022 Chronic systolic (congestive) heart failure: Secondary | ICD-10-CM

## 2015-03-12 NOTE — Progress Notes (Signed)
Traci Choi Date of Birth: 09/07/1961 Medical Record #[id number]  History of Present Illness: [name] is seen today for followup CAD. She has a history of coronary disease, peripheral vascular disease, and COPD. She was admitted in August 2014 with an anterior STEMI. She had emergent stenting of the proximal LAD with DES. She had no other significant disease. Ejection fraction was 35-40% at that time. Because of recurrent chest pain ECG changes she underwent repeat angiography during that hospitalization which showed continued stent patency. She did develop atrial fibrillation and was treated with amiodarone but this has since been discontinued. Follow up Echo in Dec. 2014 showed EF 40-45%.  On follow up today she complains of intermittent swelling. Weight fluctuates a lot. Notes that her diet was really bad 1-2 months ago and weight increased to 183 lbs with ankle swelling. Took extra lasix with improvement. [facility] and continued on albuterol prn. She has infrequent chest pain relieved with sl Ntg.   Current Outpatient Prescriptions on File Prior to Visit  Medication Sig Dispense Refill  . albuterol (PROVENTIL HFA;VENTOLIN HFA) 108 (90 BASE) MCG/ACT inhaler Inhale 2 puffs into the lungs every 6 (six) hours as needed for wheezing.    Marland Kitchen. aspirin 81 MG chewable tablet Chew 1 tablet (81 mg total) by mouth daily.    Marland Kitchen. atorvastatin (LIPITOR) 40 MG tablet Take 1 tablet (40 mg total) by mouth daily. 90 tablet 3  . benzonatate (TESSALON) 100 MG capsule Take 100 mg by mouth as needed for cough.     . bisoprolol (ZEBETA) 5 MG tablet Take 0.5 tablets (2.5 mg total) by mouth daily. 90 tablet 3  . fluticasone (FLONASE) 50 MCG/ACT nasal spray Place 2 sprays into both nostrils daily as needed for allergies.    . furosemide (LASIX) 20 MG tablet Take 1 tablet (20 mg total) by mouth daily. 90 tablet 3  . losartan (COZAAR) 25 MG tablet Take 1 tablet (25 mg total) by mouth daily. 90  tablet 3  . nitroGLYCERIN (NITROSTAT) 0.4 MG SL tablet Place 1 tablet (0.4 mg total) under the tongue every 5 (five) minutes as needed for chest pain. 25 tablet 3  . potassium chloride (K-DUR) 10 MEQ tablet Take 1 tablet (10 mEq total) by mouth daily. 90 tablet 3   No current facility-administered medications on file prior to visit.    Allergies  Allergen Reactions  . Flagyl [Metronidazole] Other (See Comments)    unknown  . Penicillins Nausea And Vomiting and Rash    Past Medical History  Diagnosis Date  . GERD (gastroesophageal reflux disease)   . Coronary artery disease     a. ant STEMI (8/14):  LHC - pLAD occluded (PCI: Promus premier 3x20 mm DES to), pOM1 40, pOM2 70-80 (small), pPDA 70-80, EF 35-40 ant HK, apex and inf AK;  b.  Re-look cath (9/14):  PLAD stent ok, D2 50-70 (small) => med Rx  . Atrial fibrillation     post MI => Amiodarone started and returned to NSR => plan to continue x 1 month (?d/c 08/2013)  . Ischemic cardiomyopathy   . Chronic systolic CHF (congestive heart failure)     echo 07/08/13: EF 35-40%, inferior, septal and apical HK.  Marland Kitchen. PAD (peripheral artery disease)   . COPD (chronic obstructive pulmonary disease)     [facility]  . Hyperlipidemia     Past Surgical History  Procedure Laterality Date  . Abdominal aorta stent    .  Left heart catheterization with coronary angiogram Bilateral 07/08/2013    Procedure: LEFT HEART CATHETERIZATION WITH CORONARY ANGIOGRAM;  Surgeon: Carmon Sahli M [place], MD;  Location: [facility] CATH LAB;  Service: Cardiovascular;  Laterality: Bilateral;  . Percutaneous stent intervention  07/08/2013    Procedure: PERCUTANEOUS STENT INTERVENTION;  Surgeon: Insiya Oshea M [place], MD;  Location: [facility] CATH LAB;  Service: Cardiovascular;;  Prox LAD DES  . Left heart catheterization with coronary angiogram N/A 07/12/2013    Procedure: LEFT HEART CATHETERIZATION WITH CORONARY ANGIOGRAM;  Surgeon: Remie Mathison M [place], MD;  Location: [facility] CATH LAB;  Service:  Cardiovascular;  Laterality: N/A;    History  Smoking status  . Never Smoker   Smokeless tobacco  . Not on file    History  Alcohol Use No    History reviewed. No pertinent family history.  Review of Systems: As noted in history of present illness.  All other systems were reviewed and are negative.  Physical Exam: BP 110/74 mmHg  Pulse 55  Ht 5' (1.524 m)  Wt 181 lb (82.101 kg)  BMI 35.35 kg/m2 She is a pleasant black female in no acute distress. HEENT: Normal Neck: No JVD or bruits. No adenopathy or thyromegaly. Lungs: Clear Cardiovascular: Regular rate and rhythm. Normal S1-S2. No gallop, murmur, or click. Abdomen: Soft, mildly obese, no organomegaly or masses. Extremities: No cyanosis. Only trace edema. Pulses are 2+ and symmetric. Skin: Warm and dry Neuro: Alert and oriented x3. Cranial nerves II through XII are intact.  LABORATORY DATA: Lab Results  Component Value Date   WBC 8.9 07/28/2013   HGB 12.9 07/28/2013   HCT 38.9 07/28/2013   PLT 299.0 07/28/2013   GLUCOSE 93 08/14/2014   CHOL 145 08/14/2014   TRIG 56 08/14/2014   HDL 49 08/14/2014   LDLCALC 85 08/14/2014   ALT 18 08/14/2014   AST 18 08/14/2014   NA 142 08/14/2014   K 4.7 08/14/2014   CL 107 08/14/2014   CREATININE 0.58 08/14/2014   BUN 14 08/14/2014   CO2 26 08/14/2014   TSH 1.451 07/08/2013   INR 1.00 07/08/2013   HGBA1C 6.1* 07/08/2013   Ecg today shows NSR with old septal infarct. No acute change.   Assessment / Plan: 1. Coronary disease status post anterior STEMI. Status post stenting of the proximal LAD with drug-eluting stent. Continue ASA. Very infrequent angina.  2. Ischemic cardiomyopathy. EF improved slightly to 40-45%. On last visit BNP was normal. Weight gain and swelling due to dietary indiscretion. Stressed importance of sodium restriction. She may take extra lasix as needed for weight over 180 lbs.   3. Atrial fibrillation. No recurrence.   4. Chronic cough. Continue  therapy per pulmonary.  5. Chronic systolic CHF. Well compensated. Continue losartan and carvedilol.    6. PAD. Status post abdominal aortic stenting in the past. Last ABIs at [facility] 1/15 were normal.     I will follow up in 6 months with fasting labs.

## 2015-03-12 NOTE — Patient Instructions (Signed)
It is very important to watch your diet- especially your salt intake.  If your weight is over 180 lbs you may take an extra furosemide.  I will see you in 6 months with fasting lab work.

## 2015-04-20 IMAGING — CR DG CHEST 1V PORT
1 series · 1 of 1 positions shown · non-contrast
Comparison: No priors.

CLINICAL DATA: ST elevation myocardial infarction.

PORTABLE CHEST - 1 VIEW

[AP]
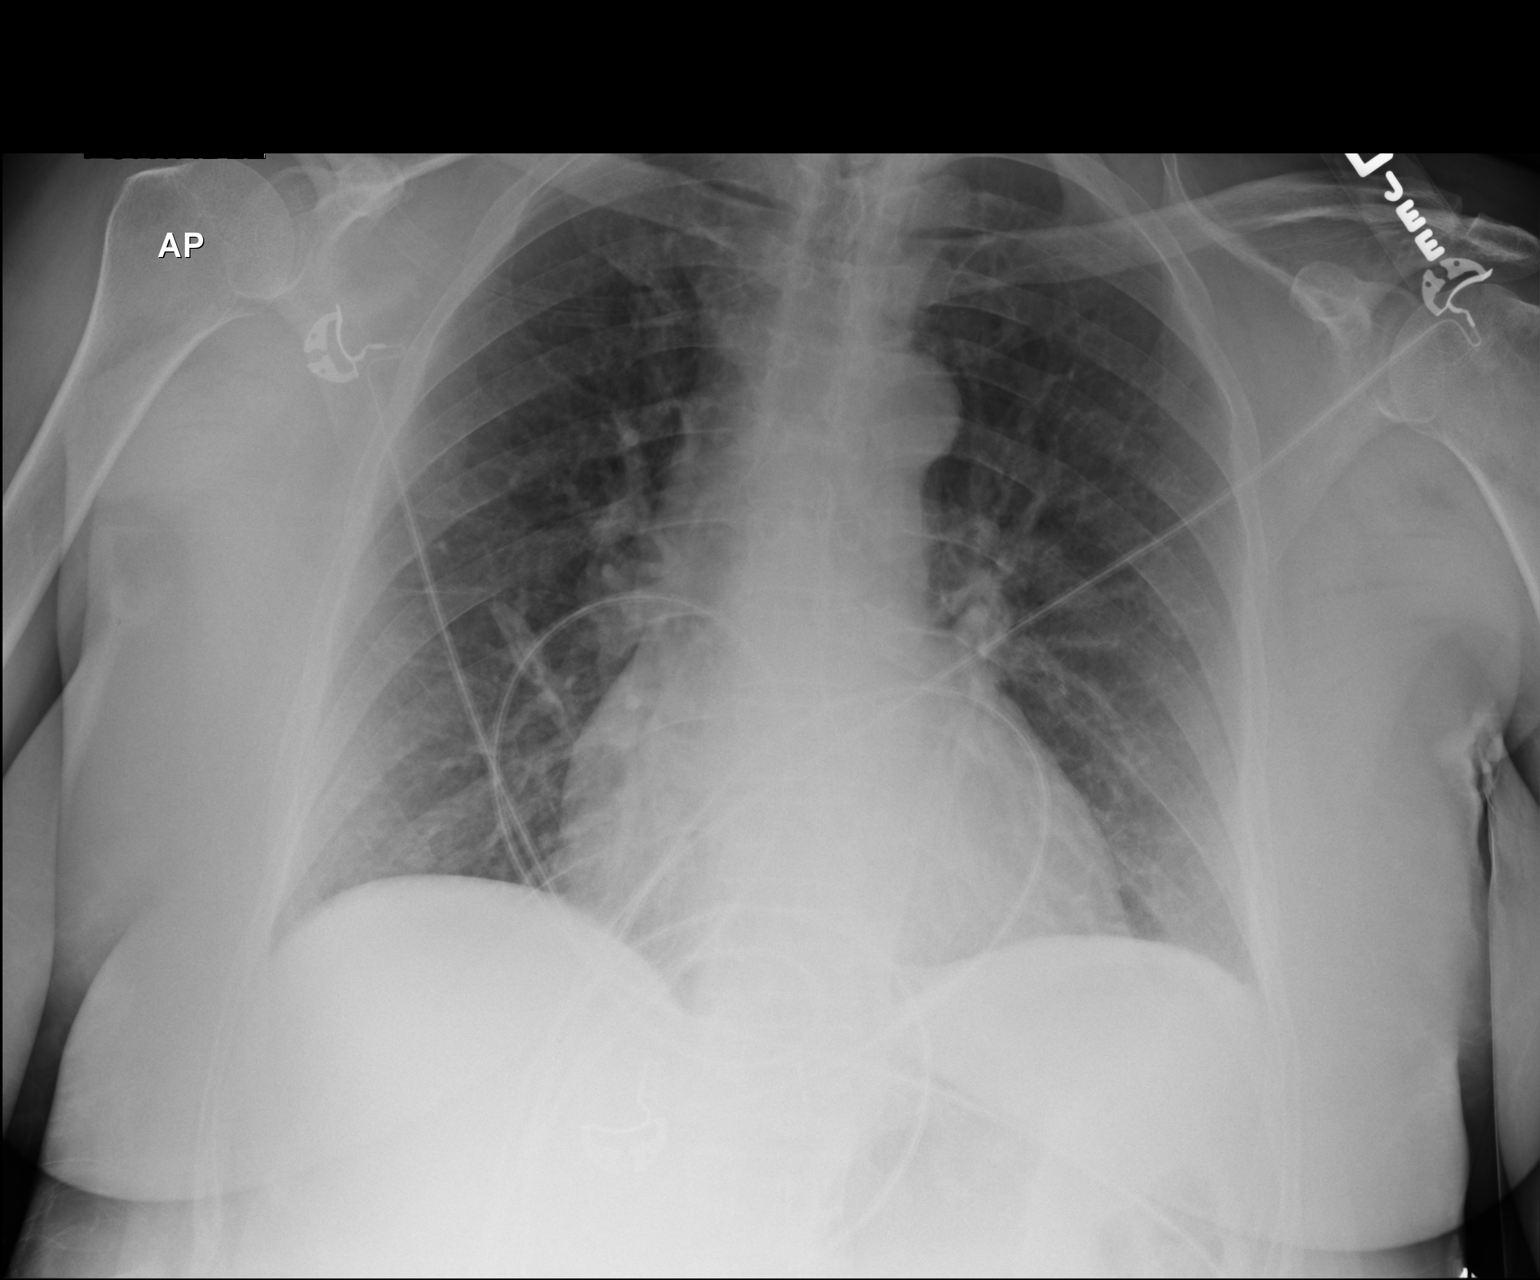

[1 of 1 positions shown; findings below may reference images not displayed]

FINDINGS: Unusual linear opacity projecting to the right of the
right hilum either within the right lower lobe or right middle
lobe.  This may represent an area of subsegmental atelectasis or
scarring, but is of uncertain etiology and significance.  Lungs are
otherwise clear.  No pleural effusions.  No evidence of pulmonary
edema.  Heart size appears borderline to mildly enlarged.
Mediastinal contours are unremarkable.
IMPRESSION: 1.  No radiographic evidence of acute cardiopulmonary disease.
2.  Borderline cardiac enlargement.
3.  Linear opacity in the right mid to lower lung, as above,
favored to represent subsegmental atelectasis or scarring.
Attention on follow-up studies is recommended.

## 2015-05-09 ENCOUNTER — Telehealth: Payer: Self-pay | Admitting: Cardiology

## 2015-05-09 NOTE — Telephone Encounter (Signed)
Please call,concerning some papers she faxed to you.

## 2015-05-11 ENCOUNTER — Other Ambulatory Visit: Payer: Self-pay

## 2015-05-11 MED ORDER — NITROGLYCERIN 0.4 MG SL SUBL: 0.4000 mg | SUBLINGUAL_TABLET | SUBLINGUAL | Status: DC | PRN

## 2015-05-16 NOTE — Telephone Encounter (Signed)
Returned call to patient 05/09/15.Received disability form.Form completed and signed by Dr.Jordan.Form faxed to [employer] at fax # [phone].Copy of form faxed to patient at fax # [phone].

## 2015-07-03 ENCOUNTER — Other Ambulatory Visit: Payer: Self-pay

## 2015-07-03 MED ORDER — ATORVASTATIN CALCIUM 40 MG PO TABS: 40.0000 mg | ORAL_TABLET | Freq: Every day | ORAL | Status: DC

## 2015-07-03 NOTE — Telephone Encounter (Signed)
Traci M Swaziland, MD at 03/12/2015 10:19 AM  atorvastatin (LIPITOR) 40 MG tablet Take 1 tablet (40 mg total) by mouth daily

## 2015-07-27 DIAGNOSIS — R2230 Localized swelling, mass and lump, unspecified upper limb: Secondary | ICD-10-CM | POA: Insufficient documentation

## 2015-07-27 DIAGNOSIS — M79646 Pain in unspecified finger(s): Secondary | ICD-10-CM | POA: Insufficient documentation

## 2015-07-27 DIAGNOSIS — L989 Disorder of the skin and subcutaneous tissue, unspecified: Secondary | ICD-10-CM | POA: Insufficient documentation

## 2015-07-27 DIAGNOSIS — J45909 Unspecified asthma, uncomplicated: Secondary | ICD-10-CM | POA: Insufficient documentation

## 2015-08-13 ENCOUNTER — Other Ambulatory Visit: Payer: Self-pay

## 2015-08-13 MED ORDER — FUROSEMIDE 20 MG PO TABS: 20.0000 mg | ORAL_TABLET | Freq: Every day | ORAL | Status: DC

## 2015-08-13 NOTE — Telephone Encounter (Signed)
Peter M Swaziland, MD at 03/12/2015 10:19 AM  furosemide (LASIX) 20 MG tabletTake 1 tablet (20 mg total) by mouth daily 2. Ischemic cardiomyopathy. EF improved slightly to 40-45%. On last visit BNP was normal. Weight gain and swelling due to dietary indiscretion. Stressed importance of sodium restriction. She may take extra lasix as needed for weight over 180 lbs.

## 2015-08-20 ENCOUNTER — Other Ambulatory Visit: Payer: Self-pay | Admitting: Cardiology

## 2015-08-20 DIAGNOSIS — I480 Paroxysmal atrial fibrillation: Secondary | ICD-10-CM

## 2015-08-20 DIAGNOSIS — I4891 Unspecified atrial fibrillation: Secondary | ICD-10-CM

## 2015-08-20 MED ORDER — BISOPROLOL FUMARATE 5 MG PO TABS: 2.5000 mg | ORAL_TABLET | Freq: Every day | ORAL | Status: DC

## 2015-08-20 MED ORDER — POTASSIUM CHLORIDE ER 10 MEQ PO TBCR: 10.0000 meq | EXTENDED_RELEASE_TABLET | Freq: Every day | ORAL | Status: DC

## 2015-08-20 MED ORDER — LOSARTAN POTASSIUM 25 MG PO TABS: 25.0000 mg | ORAL_TABLET | Freq: Every day | ORAL | Status: DC

## 2015-09-27 ENCOUNTER — Ambulatory Visit (INDEPENDENT_AMBULATORY_CARE_PROVIDER_SITE_OTHER): Payer: BLUE CROSS/BLUE SHIELD | Admitting: Cardiology

## 2015-09-27 ENCOUNTER — Encounter: Payer: Self-pay | Admitting: Cardiology

## 2015-09-27 VITALS — BP 120/82 | HR 89 | Ht 61.0 in | Wt 183.0 lb

## 2015-09-27 DIAGNOSIS — I480 Paroxysmal atrial fibrillation: Secondary | ICD-10-CM

## 2015-09-27 DIAGNOSIS — I255 Ischemic cardiomyopathy: Secondary | ICD-10-CM | POA: Diagnosis not present

## 2015-09-27 DIAGNOSIS — I5022 Chronic systolic (congestive) heart failure: Secondary | ICD-10-CM | POA: Diagnosis not present

## 2015-09-27 DIAGNOSIS — E785 Hyperlipidemia, unspecified: Secondary | ICD-10-CM

## 2015-09-27 DIAGNOSIS — I25119 Atherosclerotic heart disease of native coronary artery with unspecified angina pectoris: Secondary | ICD-10-CM | POA: Diagnosis not present
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
## 2015-09-27 NOTE — Patient Instructions (Signed)
Stop taking lipitor  Continue your other medication  I will see you in 3 months.

## 2015-12-26 LAB — LIPID PANEL
CHOL/HDL RATIO: 3.7 ratio (ref <=5.0)
CHOLESTEROL: 210 mg/dL — AB (ref 125–200)
HDL: 57 mg/dL (ref >=46)
LDL Cholesterol: 141 mg/dL — ABNORMAL HIGH (ref <130)
Triglycerides: 62 mg/dL (ref <150)
VLDL: 12 mg/dL (ref <30)

## 2015-12-26 LAB — HEPATIC FUNCTION PANEL
ALBUMIN: 3.9 g/dL (ref 3.6–5.1)
ALT: 14 U/L (ref 6–29)
AST: 18 U/L (ref 10–35)
Alkaline Phosphatase: 66 U/L (ref 33–130)
BILIRUBIN DIRECT: 0.1 mg/dL (ref <=0.2)
Indirect Bilirubin: 0.4 mg/dL (ref 0.2–1.2)
TOTAL PROTEIN: 7.4 g/dL (ref 6.1–8.1)
Total Bilirubin: 0.5 mg/dL (ref 0.2–1.2)

## 2015-12-26 LAB — BASIC METABOLIC PANEL
BUN: 16 mg/dL (ref 7–25)
CO2: 20 mmol/L (ref 20–31)
Calcium: 9.1 mg/dL (ref 8.6–10.4)
Chloride: 104 mmol/L (ref 98–110)
Creat: 0.62 mg/dL (ref 0.50–1.05)
GLUCOSE: 88 mg/dL (ref 65–99)
POTASSIUM: 4.5 mmol/L (ref 3.5–5.3)
SODIUM: 141 mmol/L (ref 135–146)

## 2015-12-28 ENCOUNTER — Ambulatory Visit (INDEPENDENT_AMBULATORY_CARE_PROVIDER_SITE_OTHER): Payer: BLUE CROSS/BLUE SHIELD | Admitting: Cardiology

## 2015-12-28 ENCOUNTER — Encounter: Payer: Self-pay | Admitting: Cardiology

## 2015-12-28 VITALS — BP 110/72 | HR 56 | Ht 60.0 in | Wt 181.0 lb

## 2015-12-28 DIAGNOSIS — I25119 Atherosclerotic heart disease of native coronary artery with unspecified angina pectoris: Secondary | ICD-10-CM

## 2015-12-28 DIAGNOSIS — E785 Hyperlipidemia, unspecified: Secondary | ICD-10-CM

## 2015-12-28 DIAGNOSIS — I5022 Chronic systolic (congestive) heart failure: Secondary | ICD-10-CM

## 2015-12-28 DIAGNOSIS — I739 Peripheral vascular disease, unspecified: Secondary | ICD-10-CM | POA: Diagnosis not present

## 2015-12-28 NOTE — Progress Notes (Signed)
Traci Choi Date of Birth: 06/12/1961 Medical Record #[id number]  History of Present Illness: Traci Choi is seen today for followup CAD. She has a history of coronary disease, peripheral vascular disease, and COPD. She was admitted in August 2014 with an anterior STEMI. She had emergent stenting of the proximal LAD with DES. She had no other significant disease. Ejection fraction was 35-40% at that time. Because of recurrent chest pain ECG changes she underwent repeat angiography during that hospitalization which showed continued stent patency. She did develop atrial fibrillation and was treated with amiodarone but this has since been discontinued. Follow up Echo in Dec. 2014 showed EF 40-45%.  On her last visit she complained of memory loss. We stopped her lipitor to see if this was a factor but she notes no change. She does note occasional symptoms of chest pain in lower sternum and under her left breast. She rests and takes a Ntg and it resolves in 20-30 minutes.   Current Outpatient Prescriptions on File Prior to Visit  Medication Sig Dispense Refill  . albuterol (PROVENTIL HFA;VENTOLIN HFA) 108 (90 BASE) MCG/ACT inhaler Inhale 2 puffs into the lungs every 6 (six) hours as needed for wheezing.    Marland Kitchen aspirin 81 MG chewable tablet Chew 1 tablet (81 mg total) by mouth daily.    . benzonatate (TESSALON) 100 MG capsule Take 100 mg by mouth as needed for cough.     . bisoprolol (ZEBETA) 5 MG tablet Take 0.5 tablets (2.5 mg total) by mouth daily. 90 tablet 1  . esomeprazole (NEXIUM) 20 MG capsule Take 20 mg by mouth daily at 12 noon.    . fluticasone (FLONASE) 50 MCG/ACT nasal spray Place 2 sprays into both nostrils daily as needed for allergies.    . furosemide (LASIX) 20 MG tablet Take 1 tablet (20 mg total) by mouth daily. 90 tablet 3  . losartan (COZAAR) 25 MG tablet Take 1 tablet (25 mg total) by mouth daily. 90 tablet 1  . nitroGLYCERIN (NITROSTAT) 0.4 MG SL tablet Place 1 tablet (0.4 mg  total) under the tongue every 5 (five) minutes as needed for chest pain. 25 tablet 3  . potassium chloride (K-DUR) 10 MEQ tablet Take 1 tablet (10 mEq total) by mouth daily. 90 tablet 1   No current facility-administered medications on file prior to visit.    Allergies  Allergen Reactions  . Flagyl [Metronidazole] Other (See Comments)    unknown  . Sulfa Antibiotics Hives  . Penicillins Nausea And Vomiting and Rash    Past Medical History  Diagnosis Date  . GERD (gastroesophageal reflux disease)   . Coronary artery disease     a. ant STEMI (8/14):  LHC - pLAD occluded (PCI: Promus premier 3x20 mm DES to), pOM1 40, pOM2 70-80 (small), pPDA 70-80, EF 35-40 ant HK, apex and inf AK;  b.  Re-look cath (9/14):  PLAD stent ok, D2 50-70 (small) => med Rx  . Atrial fibrillation (HCC)     post MI => Amiodarone started and returned to NSR => plan to continue x 1 month (?d/c 08/2013)  . Ischemic cardiomyopathy   . Chronic systolic CHF (congestive heart failure) (HCC)     echo 07/08/13: EF 35-40%, inferior, septal and apical HK.  Marland Kitchen PAD (peripheral artery disease) (HCC)   . COPD (chronic obstructive pulmonary disease) (HCC)     followed at [facility]  . Hyperlipidemia     Past Surgical History  Procedure Laterality Date  . Abdominal aorta  stent    . Left heart catheterization with coronary angiogram Bilateral 07/08/2013    Procedure: LEFT HEART CATHETERIZATION WITH CORONARY ANGIOGRAM;  Surgeon: Peter M Swaziland, MD;  Location: [facility] CATH LAB;  Service: Cardiovascular;  Laterality: Bilateral;  . Percutaneous stent intervention  07/08/2013    Procedure: PERCUTANEOUS STENT INTERVENTION;  Surgeon: Peter M Swaziland, MD;  Location: [facility] CATH LAB;  Service: Cardiovascular;;  Prox LAD DES  . Left heart catheterization with coronary angiogram N/A 07/12/2013    Procedure: LEFT HEART CATHETERIZATION WITH CORONARY ANGIOGRAM;  Surgeon: Peter M Swaziland, MD;  Location: [facility] CATH LAB;  Service: Cardiovascular;  Laterality: N/A;     History  Smoking status  . Never Smoker   Smokeless tobacco  . Not on file    History  Alcohol Use No    History reviewed. No pertinent family history.  Review of Systems: As noted in history of present illness.  All other systems were reviewed and are negative.  Physical Exam: BP 110/72 mmHg  Pulse 56  Ht 5' (1.524 m)  Wt 82.101 kg (181 lb)  BMI 35.35 kg/m2 She is a pleasant black female in no acute distress. HEENT: Normal Neck: No JVD or bruits. No adenopathy or thyromegaly. Lungs: Clear Cardiovascular: Regular rate and rhythm. Normal S1-S2. No gallop, murmur, or click. Abdomen: Soft, mildly obese, no organomegaly or masses. Extremities: No cyanosis. Only trace edema. Pulses are 2+ and symmetric. Skin: Warm and dry Neuro: Alert and oriented x3. Cranial nerves II through XII are intact.  LABORATORY DATA: Lab Results  Component Value Date   WBC 8.9 07/28/2013   HGB 12.9 07/28/2013   HCT 38.9 07/28/2013   PLT 299.0 07/28/2013   GLUCOSE 88 12/25/2015   CHOL 210* 12/25/2015   TRIG 62 12/25/2015   HDL 57 12/25/2015   LDLCALC 141* 12/25/2015   ALT 14 12/25/2015   AST 18 12/25/2015   NA 141 12/25/2015   K 4.5 12/25/2015   CL 104 12/25/2015   CREATININE 0.62 12/25/2015   BUN 16 12/25/2015   CO2 20 12/25/2015   TSH 1.451 07/08/2013   INR 1.00 07/08/2013   HGBA1C 6.1* 07/08/2013     Assessment / Plan: 1. Coronary disease status post anterior STEMI. Status post stenting of the proximal LAD with drug-eluting stent. Continue ASA. She is having some atypical chest pain. I have recommended a Lexiscan myoview study.   2. Ischemic cardiomyopathy. EF improved slightly to 40-45%.  Will repeat EF by Myoview. Stressed importance of sodium restriction. She may take extra lasix as needed for weight gain. She appears euvolemic today.  3. Atrial fibrillation. No recurrence.   4.  Chronic systolic CHF. Well compensated. Continue losartan and carvedilol.    5. Memory  loss. Etiology unclear. No difference with stopping lipitor. Needs to follow up with primary care.  6. PAD. Status post abdominal aortic stenting in the past. Last ABIs at [facility] 1/15 were normal.   7. Hypercholesterolemia. Will resume lipitor 40 mg daily. Repeat fasting labs next visit.      I will follow up in 6 months with fasting labs.

## 2015-12-28 NOTE — Patient Instructions (Signed)
Resume lipitor 40 mg daily  We will schedule you for a nuclear stress test.  I will see you in 6 months

## 2016-01-08 ENCOUNTER — Telehealth (HOSPITAL_COMMUNITY): Payer: Self-pay

## 2016-01-08 NOTE — Telephone Encounter (Signed)
ec

## 2016-01-08 NOTE — Telephone Encounter (Signed)
Encounter complete. 

## 2016-01-10 ENCOUNTER — Ambulatory Visit (HOSPITAL_COMMUNITY)
Admission: RE | Admit: 2016-01-10 | Discharge: 2016-01-10 | Disposition: A | Discharge status: Home / Self Care | Payer: BLUE CROSS/BLUE SHIELD | Source: Ambulatory Visit | Attending: Cardiology | Admitting: Cardiology

## 2016-01-10 DIAGNOSIS — R42 Dizziness and giddiness: Secondary | ICD-10-CM | POA: Diagnosis not present

## 2016-01-10 DIAGNOSIS — Z8249 Family history of ischemic heart disease and other diseases of the circulatory system: Secondary | ICD-10-CM | POA: Diagnosis not present

## 2016-01-10 DIAGNOSIS — E785 Hyperlipidemia, unspecified: Secondary | ICD-10-CM

## 2016-01-10 DIAGNOSIS — Z6835 Body mass index (BMI) 35.0-35.9, adult: Secondary | ICD-10-CM | POA: Diagnosis not present

## 2016-01-10 DIAGNOSIS — I5022 Chronic systolic (congestive) heart failure: Secondary | ICD-10-CM

## 2016-01-10 DIAGNOSIS — R079 Chest pain, unspecified: Secondary | ICD-10-CM | POA: Insufficient documentation

## 2016-01-10 DIAGNOSIS — I739 Peripheral vascular disease, unspecified: Secondary | ICD-10-CM | POA: Diagnosis not present

## 2016-01-10 DIAGNOSIS — E669 Obesity, unspecified: Secondary | ICD-10-CM | POA: Insufficient documentation

## 2016-01-10 DIAGNOSIS — R5383 Other fatigue: Secondary | ICD-10-CM | POA: Diagnosis not present

## 2016-01-10 DIAGNOSIS — I25119 Atherosclerotic heart disease of native coronary artery with unspecified angina pectoris: Secondary | ICD-10-CM | POA: Diagnosis not present

## 2016-01-10 LAB — MYOCARDIAL PERFUSION IMAGING
CHL CUP NUCLEAR SSS: 9
CSEPPHR: 91 {beats}/min
LVDIAVOL: 82 mL
LVSYSVOL: 37 mL
Rest HR: 44 {beats}/min
SDS: 4
SRS: 5
TID: 1.22

## 2016-01-10 MED ORDER — TECHNETIUM TC 99M SESTAMIBI GENERIC - CARDIOLITE
30.9000 | Freq: Once | INTRAVENOUS | Status: AC | PRN
  Administered 2016-01-10: 30.9 via INTRAVENOUS

## 2016-01-10 MED ORDER — REGADENOSON 0.4 MG/5ML IV SOLN
0.4000 mg | Freq: Once | INTRAVENOUS | Status: AC
  Administered 2016-01-10: 0.4 mg via INTRAVENOUS

## 2016-01-10 MED ORDER — TECHNETIUM TC 99M SESTAMIBI GENERIC - CARDIOLITE
10.0000 | Freq: Once | INTRAVENOUS | Status: AC | PRN
  Administered 2016-01-10: 10 via INTRAVENOUS

## 2016-02-14 ENCOUNTER — Other Ambulatory Visit: Payer: Self-pay

## 2016-02-14 DIAGNOSIS — I5022 Chronic systolic (congestive) heart failure: Secondary | ICD-10-CM

## 2016-02-14 DIAGNOSIS — I4891 Unspecified atrial fibrillation: Secondary | ICD-10-CM

## 2016-02-14 MED ORDER — LOSARTAN POTASSIUM 25 MG PO TABS: 25.0000 mg | ORAL_TABLET | Freq: Every day | ORAL | Status: DC

## 2016-02-14 MED ORDER — FUROSEMIDE 20 MG PO TABS: 20.0000 mg | ORAL_TABLET | Freq: Every day | ORAL | Status: DC

## 2016-02-14 MED ORDER — POTASSIUM CHLORIDE ER 10 MEQ PO TBCR: 10.0000 meq | EXTENDED_RELEASE_TABLET | Freq: Every day | ORAL | Status: DC

## 2016-02-14 MED ORDER — BISOPROLOL FUMARATE 5 MG PO TABS: 2.5000 mg | ORAL_TABLET | Freq: Every day | ORAL | Status: DC

## 2016-03-11 ENCOUNTER — Telehealth: Payer: Self-pay | Admitting: Cardiology

## 2016-03-11 NOTE — Telephone Encounter (Signed)
Symptoms sound atypical for cardiac pain. Recent stress test normal. ? Related to GERD or allergies. Try avoiding spicy foods. Take Nexium daily- may take twice a day if needed.  Arabia Nylund SwazilandJordan MD, [facility]

## 2016-03-11 NOTE — Telephone Encounter (Signed)
Goes to VM box which is not set up to receive messages.

## 2016-03-11 NOTE — Telephone Encounter (Signed)
Called back and sister answered - not available at this time- requested pt call back when available.

## 2016-03-11 NOTE — Telephone Encounter (Signed)
Received return call from patient. She notes she has recently had episodes recurrently of dull burning in throat, neck, w/ pressure in center of chest. She has taken Nexium OTC which has provided some relief.  Notes a recent episode Saturday night was resolved by taking 2x NTG SL. She felt better but had a return of this on Sunday, for which 1x NTG was taken.  Pt requesting help on what to do. She is somewhat anxious. We did review recent stress test for which findings were normal/reassuring.  Will route to Dr. SwazilandJordan for any advice.

## 2016-03-11 NOTE — Telephone Encounter (Signed)
New message   [facility], she is having tightness in chest NOT CHEST PAIN  Feeling is also in her throat pt verbalized it like right after you eat, indigestion feeling

## 2016-03-14 ENCOUNTER — Telehealth: Payer: Self-pay | Admitting: Cardiology

## 2016-03-14 NOTE — Telephone Encounter (Signed)
NEw MEssage   Pt requested to speak w/ RN concerning her upcoming appt on 5/8 w/ SwazilandJordan. Please call back and discuss.

## 2016-03-14 NOTE — Telephone Encounter (Signed)
Returned call. Pt explains she went for ED evaluation earlier in the week - She had apparently missed notification of my attempt to call and became concerned over increase in chest discomfort. She had [place] cath at [place] hosp in [place] that revealed a 50% blockage in 1 of her arteries. She was set up for an appt Mon 5/8 and wanted to make sure she needed to come. I advised yes and also advised on Nexium increase per Dr. Elvis CoilJordan's recommendations, in case this helps. Pt voiced agreement w/ plan to come in and get further recommendations at visit.

## 2016-03-17 ENCOUNTER — Ambulatory Visit (INDEPENDENT_AMBULATORY_CARE_PROVIDER_SITE_OTHER): Payer: BLUE CROSS/BLUE SHIELD | Admitting: Cardiology

## 2016-03-17 ENCOUNTER — Encounter: Payer: Self-pay | Admitting: Cardiology

## 2016-03-17 VITALS — BP 104/66 | HR 50 | Ht 60.0 in | Wt 178.0 lb

## 2016-03-17 DIAGNOSIS — I739 Peripheral vascular disease, unspecified: Secondary | ICD-10-CM | POA: Diagnosis not present

## 2016-03-17 DIAGNOSIS — I25119 Atherosclerotic heart disease of native coronary artery with unspecified angina pectoris: Secondary | ICD-10-CM

## 2016-03-17 DIAGNOSIS — K219 Gastro-esophageal reflux disease without esophagitis: Secondary | ICD-10-CM

## 2016-03-17 DIAGNOSIS — I255 Ischemic cardiomyopathy: Secondary | ICD-10-CM | POA: Diagnosis not present

## 2016-03-17 DIAGNOSIS — E785 Hyperlipidemia, unspecified: Secondary | ICD-10-CM

## 2016-03-17 NOTE — Patient Instructions (Signed)
Continue your current therapy  I will see you in 6 months.   

## 2016-03-17 NOTE — Progress Notes (Signed)
Traci Choi Date of Birth: 08/23/1961 Medical Record #[id number]  History of Present Illness: Traci Choi is seen today for followup CAD. She has a history of coronary disease, peripheral vascular disease, and COPD. She was admitted in August 2014 with an anterior STEMI. She had emergent stenting of the proximal LAD with DES. She had 70-80% disease in a small OM2 and PDA. Treated medically.  Ejection fraction was 35-40% at that time. Because of recurrent chest pain ECG changes she underwent repeat angiography during that hospitalization which showed continued stent patency. She did develop atrial fibrillation and was treated with amiodarone but this has since been discontinued. Follow up Echo in Dec. 2014 showed EF 40-45%. In March 2017 she had a Myoview study that showed fixed defects in the mid apical anterior and apical inferior walls. No ischemia. EF 55%.  In May [place] in [place] with recurrent chest and throat pain. States she had constant pain for 3 days. Improved with Ntg. She ruled out for MI. LDL was 81. CXR was normal. Cardiac cath was performed showing a patent stent in the LAD. There was 40-50% stenosis in first diagonal, second OM and PDA. She was treated for GERD and has resolution of her chest pain.  She continues to note memory loss. We stopped her lipitor to see if this was a factor but she noted no change. She is back on lipitor now.   Current Outpatient Prescriptions on File Prior to Visit  Medication Sig Dispense Refill  . albuterol (PROVENTIL HFA;VENTOLIN HFA) 108 (90 BASE) MCG/ACT inhaler Inhale 2 puffs into the lungs every 6 (six) hours as needed for wheezing.    Marland Kitchen. aspirin 81 MG chewable tablet Chew 1 tablet (81 mg total) by mouth daily.    . benzonatate (TESSALON) 100 MG capsule Take 100 mg by mouth as needed for cough.     . bisoprolol (ZEBETA) 5 MG tablet Take 0.5 tablets (2.5 mg total) by mouth daily. 90 tablet 1  . fluticasone (FLONASE) 50 MCG/ACT  nasal spray Place 2 sprays into both nostrils daily as needed for allergies.    . furosemide (LASIX) 20 MG tablet Take 1 tablet (20 mg total) by mouth daily. 90 tablet 3  . losartan (COZAAR) 25 MG tablet Take 1 tablet (25 mg total) by mouth daily. 90 tablet 3  . nitroGLYCERIN (NITROSTAT) 0.4 MG SL tablet Place 1 tablet (0.4 mg total) under the tongue every 5 (five) minutes as needed for chest pain. 25 tablet 3  . potassium chloride (K-DUR) 10 MEQ tablet Take 1 tablet (10 mEq total) by mouth daily. 90 tablet 3   No current facility-administered medications on file prior to visit.    Allergies  Allergen Reactions  . Flagyl [Metronidazole] Other (See Comments)    unknown  . Sulfa Antibiotics Hives  . Penicillins Nausea And Vomiting and Rash    Past Medical History  Diagnosis Date  . GERD (gastroesophageal reflux disease)   . Coronary artery disease     a. ant STEMI (8/14):  LHC - pLAD occluded (PCI: Promus premier 3x20 mm DES to), pOM1 40, pOM2 70-80 (small), pPDA 70-80, EF 35-40 ant HK, apex and inf AK;  b.  Re-look cath (9/14):  PLAD stent ok, D2 50-70 (small) => med Rx  . Atrial fibrillation (HCC)     post MI => Amiodarone started and returned to NSR => plan to continue x 1 month (?d/c 08/2013)  . Ischemic cardiomyopathy   .  Chronic systolic CHF (congestive heart failure) (HCC)     echo 07/08/13: EF 35-40%, inferior, septal and apical HK.  Marland Kitchen PAD (peripheral artery disease) (HCC)   . COPD (chronic obstructive pulmonary disease) (HCC)     followed at [facility]  . Hyperlipidemia     Past Surgical History  Procedure Laterality Date  . Abdominal aorta stent    . Left heart catheterization with coronary angiogram Bilateral 07/08/2013    Procedure: LEFT HEART CATHETERIZATION WITH CORONARY ANGIOGRAM;  Surgeon: Peter M Swaziland, MD;  Location: [facility] CATH LAB;  Service: Cardiovascular;  Laterality: Bilateral;  . Percutaneous stent intervention  07/08/2013    Procedure: PERCUTANEOUS STENT  INTERVENTION;  Surgeon: Peter M Swaziland, MD;  Location: [facility] CATH LAB;  Service: Cardiovascular;;  Prox LAD DES  . Left heart catheterization with coronary angiogram N/A 07/12/2013    Procedure: LEFT HEART CATHETERIZATION WITH CORONARY ANGIOGRAM;  Surgeon: Peter M Swaziland, MD;  Location: [facility] CATH LAB;  Service: Cardiovascular;  Laterality: N/A;    History  Smoking status  . Never Smoker   Smokeless tobacco  . Not on file    History  Alcohol Use No    History reviewed. No pertinent family history.  Review of Systems: As noted in history of present illness.  All other systems were reviewed and are negative.  Physical Exam: BP 104/66 mmHg  Pulse 50  Ht 5' (1.524 m)  Wt 80.74 kg (178 lb)  BMI 34.76 kg/m2 She is a pleasant black female in no acute distress. HEENT: Normal Neck: No JVD or bruits. No adenopathy or thyromegaly. Lungs: Clear Cardiovascular: Regular rate and rhythm. Normal S1-S2. No gallop, murmur, or click. Abdomen: Soft, mildly obese, no organomegaly or masses. Extremities: No cyanosis. Only trace edema. Pulses are 2+ and symmetric. Radial cath site without hematoma.  Skin: Warm and dry Neuro: Alert and oriented x3. Cranial nerves II through XII are intact.  LABORATORY DATA: Lab Results  Component Value Date   WBC 8.9 07/28/2013   HGB 12.9 07/28/2013   HCT 38.9 07/28/2013   PLT 299.0 07/28/2013   GLUCOSE 88 12/25/2015   CHOL 210* 12/25/2015   TRIG 62 12/25/2015   HDL 57 12/25/2015   LDLCALC 141* 12/25/2015   ALT 14 12/25/2015   AST 18 12/25/2015   NA 141 12/25/2015   K 4.5 12/25/2015   CL 104 12/25/2015   CREATININE 0.62 12/25/2015   BUN 16 12/25/2015   CO2 20 12/25/2015   TSH 1.451 07/08/2013   INR 1.00 07/08/2013   HGBA1C 6.1* 07/08/2013   Ecg today shows NSR with rate 51. 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 I have personally reviewed and interpreted this study.   Assessment / Plan: 1. Coronary disease status post anterior STEMI. Status post stenting of the  proximal LAD with drug-eluting stent in 2014.  Continue ASA. She had some atypical chest pain. Myoview study in March showed no ischemia with some scar. EF 55%. Cardiac cath in May in [facility] hospital showed no obstructive disease. I suspect her pain was related to GERD. Continue Protonix. If symptoms recur she will need GI evaluation.   2. Ischemic cardiomyopathy. EF improved to 55% by Myoview. Also normal by recent Cath. Stressed importance of sodium restriction. She may take extra lasix as needed for weight gain. She appears euvolemic today.  3. Atrial fibrillation. No recurrence.   4.  Memory loss. Etiology unclear. No difference with stopping lipitor. Needs to follow up with primary care or consider seeing Neurology.  6. PAD.  Status post abdominal aortic stenting in the past. Last ABIs at [facility] 1/15 were normal.   7. Hypercholesterolemia. LDL improved to 81 on lipitor. Continue current therapy.   I will follow up in 6 months with fasting labs.

## 2016-04-02 ENCOUNTER — Other Ambulatory Visit: Payer: Self-pay | Admitting: Cardiology

## 2016-04-02 MED ORDER — PANTOPRAZOLE SODIUM 40 MG PO TBEC: 40.0000 mg | DELAYED_RELEASE_TABLET | Freq: Every day | ORAL | Status: DC

## 2016-04-02 NOTE — Telephone Encounter (Signed)
Pantoprazole  Rx(s) sent to pharmacy electronically.

## 2016-07-09 ENCOUNTER — Other Ambulatory Visit: Payer: Self-pay | Admitting: *Deleted

## 2016-07-09 MED ORDER — ATORVASTATIN CALCIUM 40 MG PO TABS: 40.0000 mg | ORAL_TABLET | Freq: Every day | ORAL | 6 refills | Status: DC

## 2016-07-09 NOTE — Telephone Encounter (Signed)
Refilled atorvastatin 40 mg # 30 with 6 refills.

## 2016-07-10 ENCOUNTER — Telehealth: Payer: Self-pay | Admitting: Cardiology

## 2016-07-10 MED ORDER — ATORVASTATIN CALCIUM 40 MG PO TABS: 40.0000 mg | ORAL_TABLET | Freq: Every day | ORAL | 6 refills | Status: DC

## 2016-07-23 ENCOUNTER — Telehealth: Payer: Self-pay | Admitting: Cardiology

## 2016-07-23 NOTE — Telephone Encounter (Signed)
New message   Pt just got out of the hospital and was told to see the dr in 2 weeks. No appt available until Nov w/both the doc and the pa. I offered those appt and the pt declined. Please call.

## 2016-07-23 NOTE — Telephone Encounter (Signed)
Attempted to reach patient. Unable to connect to phone number provided.

## 2016-07-25 NOTE — Telephone Encounter (Signed)
Spoke to patient. Reports she was admitted to [facility] recently for an overnight obs. Had EKG, stress test, etc after episode of chest tightness and arm pain. Notes workup ok, she was advised to follow up w her cardiologist in 2 weeks. Aware that she is coming up on annual visit but that Dr. SwazilandJordan not available until November.  Offered to have scheduler reach out to her and arrange PA visit w/in 2 week timeframe - patient agreeable to this. Will also send a msg to medical records to obtain notes from recent hospitalization.

## 2016-07-25 NOTE — Telephone Encounter (Signed)
No answer on cell phone, goes to VM box not set up to receive messages.

## 2016-08-07 ENCOUNTER — Ambulatory Visit (INDEPENDENT_AMBULATORY_CARE_PROVIDER_SITE_OTHER): Payer: BLUE CROSS/BLUE SHIELD | Admitting: Student

## 2016-08-07 ENCOUNTER — Encounter: Payer: Self-pay | Admitting: Student

## 2016-08-07 VITALS — BP 110/70 | HR 51 | Ht 60.0 in | Wt 178.2 lb

## 2016-08-07 DIAGNOSIS — I2510 Atherosclerotic heart disease of native coronary artery without angina pectoris: Secondary | ICD-10-CM | POA: Diagnosis not present

## 2016-08-07 DIAGNOSIS — I255 Ischemic cardiomyopathy: Secondary | ICD-10-CM

## 2016-08-07 DIAGNOSIS — E785 Hyperlipidemia, unspecified: Secondary | ICD-10-CM | POA: Diagnosis not present

## 2016-08-07 DIAGNOSIS — I10 Essential (primary) hypertension: Secondary | ICD-10-CM | POA: Diagnosis not present

## 2016-08-07 NOTE — Progress Notes (Signed)
Cardiology Office Note    Date:  08/07/2016   ID:  Traci Choi, DOB October 12, 1961, MRN [id number]  PCP:  No PCP Per Patient  Cardiologist:  Dr. Swaziland  Chief Complaint  Patient presents with  . Hospitalization Follow-up    History of Present Illness:    Traci Choi is a 55 y.o. female with past medical history of CAD (s/p anterior STEMI in 06/2013 with DES to pLAD, 70-80% stenosis OM2 treated medically, repeat cath 03/2016 with patent stent in LAD, 50% D1 stenosis, and 50% OM2 stenosis), ischemic cardiomyopathy (EF 40% in 2014, improved to 55-60% by cath in 03/2016),  PAF (following MI in 2014, no recurrence since and not on anticoagulation), GERD, HTN, HLD, and PAF (s/p abdominal aortic stenting followed by [facility]) who presents to the office today for hospital follow-up.   She was recently admitted at [facility] on 07/17/2016 for chest pain. Reports she had developed right arm numbness and tingling throughout the day. Later that evening, she noted a pain radiating into her right neck which was somewhat similar to symptoms she experienced in 06/2013, therefore she went to the ER. She denied any chest discomfort, diaphoresis, or dyspnea with exertion (all of which she experienced in 06/2013 as well). She brought with her today hospital records are notable results include:   Negative troponin values. Echo showing normal echo with EF of 55-65% and no wall motion abnormalities. No significant valve abnormalities. She underwent a NST which showed a large, severe partially reversible defect involving the mid-apical anterior, anteroseptal, apical inferior, apical septal, and apical walls, thought to be consistent with previous infarction and peri-infarct ischemia. Overall interrpreted as moderate-risk. Images are not available for review.  In talking with the patient today, she denies any symptoms since her recent hospitalization. No chest discomfort or dyspnea with exertion. No recurrent right  arm or right jaw pain. She has been exercising 3-4 times per week with a stationary bike and denies any symptoms.  Denies any lower extremity edema, orthopnea, or PND. Says she has been "feeling great". Has not had to take any SL NTG. Does carry this if needed. Reports good compliance with her medications.   Past Medical History:  Diagnosis Date  . Atrial fibrillation (HCC)    post MI => Amiodarone started and returned to NSR => plan to continue x 1 month (?d/c 08/2013)  . Chronic systolic CHF (congestive heart failure) (HCC)    echo 07/08/13: EF 35-40%, inferior, septal and apical HK.  Marland Kitchen COPD (chronic obstructive pulmonary disease) (HCC)    [facility] at [facility]  . Coronary artery disease    a. ant STEMI (8/14):  LHC - pLAD occluded (PCI: Promus premier 3x20 mm DES to), pOM1 40, pOM2 70-80 (small), pPDA 70-80, EF 35-40 ant HK, apex and inf AK;  b.  Re-look cath (9/14):  PLAD stent ok, D2 50-70 (small) => med Rx  . GERD (gastroesophageal reflux disease)   . Hyperlipidemia   . Ischemic cardiomyopathy   . PAD (peripheral artery disease) (HCC)     Past Surgical History:  Procedure Laterality Date  . ABDOMINAL AORTA STENT    . LEFT HEART CATHETERIZATION WITH CORONARY ANGIOGRAM Bilateral 07/08/2013   Procedure: LEFT HEART CATHETERIZATION WITH CORONARY ANGIOGRAM;  Surgeon: Peter M Swaziland, MD;  Location: [facility] C[facility] LAB;  Service: Cardiovascular;  Laterality: Bilateral;  . LEFT HEART CATHETERIZATION WITH CORONARY ANGIOGRAM N/A 07/12/2013   Procedure: LEFT HEART CATHETERIZATION WITH CORONARY ANGIOGRAM;  Surgeon: Peter M Swaziland, MD;  Location: [facility] CATH LAB;  Service: Cardiovascular;  Laterality: N/A;  . PERCUTANEOUS STENT INTERVENTION  07/08/2013   Procedure: PERCUTANEOUS STENT INTERVENTION;  Surgeon: Peter M SwazilandJordan, MD;  [facility];  Service: Cardiovascular;;  Prox LAD DES    Current Medications: Outpatient Medications Prior to Visit  Medication Sig Dispense Refill  . albuterol (PROVENTIL  HFA;VENTOLIN HFA) 108 (90 BASE) MCG/ACT inhaler Inhale 2 puffs into the lungs every 6 (six) hours as needed for wheezing.    Marland Kitchen. aspirin 81 MG chewable tablet Chew 1 tablet (81 mg total) by mouth daily.    Marland Kitchen. atorvastatin (LIPITOR) 40 MG tablet Take 1 tablet (40 mg total) by mouth daily. 30 tablet 6  . benzonatate (TESSALON) 100 MG capsule Take 100 mg by mouth as needed for cough.     . bisoprolol (ZEBETA) 5 MG tablet Take 0.5 tablets (2.5 mg total) by mouth daily. 90 tablet 1  . fluticasone (FLONASE) 50 MCG/ACT nasal spray Place 2 sprays into both nostrils daily as needed for allergies.    . furosemide (LASIX) 20 MG tablet Take 1 tablet (20 mg total) by mouth daily. 90 tablet 3  . losartan (COZAAR) 25 MG tablet Take 1 tablet (25 mg total) by mouth daily. 90 tablet 3  . pantoprazole (PROTONIX) 40 MG tablet Take 1 tablet (40 mg total) by mouth daily. 30 tablet 11  . potassium chloride (K-DUR) 10 MEQ tablet Take 1 tablet (10 mEq total) by mouth daily. 90 tablet 3  . nitroGLYCERIN (NITROSTAT) 0.4 MG SL tablet Place 1 tablet (0.4 mg total) under the tongue every 5 (five) minutes as needed for chest pain. 25 tablet 3   No facility-administered medications prior to visit.      Allergies:   Flagyl [metronidazole]; Sulfa antibiotics; Codeine; Other; and Penicillins   Social History   Social History  . Marital status: Married    Spouse name: N/A  . Number of children: N/A  . Years of education: N/A   Social History Main Topics  . Smoking status: Never Smoker  . Smokeless tobacco: None  . Alcohol use No  . Drug use: Unknown  . Sexual activity: Not Asked   Other Topics Concern  . None   Social History Narrative  . None     Family History:  The patient's family history includes Heart attack in her mother.   Review of Systems:   Please see the history of present illness.    Review of Systems  Constitution: Negative for chills, fever and malaise/fatigue.  Cardiovascular: Negative for  chest pain, dyspnea on exertion, irregular heartbeat, leg swelling, near-syncope, palpitations and syncope.  Respiratory: Negative for cough, hemoptysis and shortness of breath.   Hematologic/Lymphatic: Negative for bleeding problem. Does not bruise/bleed easily.  Musculoskeletal: Negative for falls and muscle weakness.  Gastrointestinal: Negative for constipation, diarrhea, nausea and vomiting.  Genitourinary: Negative for dysuria and hematuria.  Psychiatric/Behavioral: Negative for altered mental status. The patient is not nervous/anxious.    All other systems reviewed and are negative.   Physical Exam:    VS:  BP 110/70   Pulse (!) 51   Ht 5' (1.524 m)   Wt 178 lb 3.2 oz (80.8 kg)   BMI 34.80 kg/m    General: Well developed, well nourished,female appearing in no acute distress. Head: Normocephalic, atraumatic, sclera non-icteric, no xanthomas, nares are without discharge.  Neck: No carotid bruits. JVD not elevated.  Lungs: Respirations regular and unlabored, without wheezes or rales.  Heart:  Regular rate and rhythm. No S3 or S4.  No murmur, no rubs, or gallops appreciated. Abdomen: Soft, non-tender, non-distended with normoactive bowel sounds. No hepatomegaly. No rebound/guarding. No obvious abdominal masses. Msk:  Strength and tone appear normal for age. No joint deformities or effusions. Extremities: No clubbing or cyanosis. No edema.  Distal pedal pulses are 2+ bilaterally. Neuro: Alert and oriented X 3. Moves all extremities spontaneously. No focal deficits noted. Psych:  Responds to questions appropriately with a normal affect. Skin: No rashes or lesions noted  Wt Readings from Last 3 Encounters:  08/07/16 178 lb 3.2 oz (80.8 kg)  03/17/16 178 lb (80.7 kg)  01/10/16 181 lb (82.1 kg)     Studies/Labs Reviewed:   EKG:  EKG is ordered today.  The ekg ordered today demonstrates sinus bradycardia, HR 51, with no acute ST or T-wave changes noted when compared to previous  tracings.   Recent Labs: 12/25/2015: ALT 14; BUN 16; Creat 0.62; Potassium 4.5; Sodium 141   Lipid Panel    Component Value Date/Time   CHOL 210 (H) 12/25/2015 1354   TRIG 62 12/25/2015 1354   HDL 57 12/25/2015 1354   CHOLHDL 3.7 12/25/2015 1354   VLDL 12 12/25/2015 1354   LDLCALC 141 (H) 12/25/2015 1354    Additional studies/ records that were reviewed today include:   Cardiac Catheterization: 03/2016   Echocardiogram: 07/2016 EF of 55-65% and no wall motion abnormalities. No significant valve abnormalities.   NST: 07/2016 Large, severe partially reversible defect involving the mid-apical anterior, anteroseptal, apical inferior, apical septal, and apical walls, thought to be consistent with previous infarction and peri-infarct ischemia. Overall interrpreted as moderate-risk. Images are not available for review.   Assessment:    1. Coronary artery disease involving native coronary artery of native heart without angina pectoris   2. Cardiomyopathy, ischemic   3. Essential hypertension   4. Dyslipidemia      Plan:   In order of problems listed above:  1. Coronary Artery Disease without angina pectoris - has known coronary disease, s/p anterior STEMI in 06/2013 with DES to pLAD, 70-80% stenosis OM2 treated medically. She had a repeat cath in 03/2016 with patent stent in LAD, 50% D1 stenosis, and 50% OM2 stenosis while hospitalized in [place], [place].  - recently hospitalized on 07/17/2016 with right arm pain and jaw pain. No chest discomfort or dyspnea like she experienced in 2014. Had negative troponin values and echo showed EF of 55-65% with no wall motion abnormalities. NST which showed a large, severe partially reversible defect involving the mid-apical anterior, anteroseptal, apical inferior, apical septal, and apical walls, thought to be consistent with previous infarction and peri-infarct ischemia. Overall interrpreted as moderate-risk. Images are not available for  review. - she denies any recent chest pain or dyspnea with exertion. Has been exercising 3-4 times per week with a stationary bike and denies any anginal symptoms. EKG today demonstrates no acute ST or T-wave changes. - with her recent cardiac catheterization in 03/2016 showing a patent LAD stent with known 50% stenosis in D1 and OM2 and no recent symptoms, would not pursue further ischemic evaluation at this time. - continue ASA, statin, BB, and ARB. Has PRN SL NTG.   2. Ischemic Cardiomyopathy - EF was 40% in 2014 following STEMI. Improved to 55-65% by recent echo on 07/18/2016. - does not appear volume overloaded on physical exam. Denies any lower extremity edema, orthopnea, or PND. - continue PO Lasix, BB, and ARB.  3. HTN -  BP well-controlled. At 110/70 at today's visit.  - continue Bisoprolol, Lasix, and Losartan. Creatinine 0.7 during recent hospitalization.  4. HLD - Lipid Panel during recent hospitalization showed LDL of 83, improved from 141 in 12/2015. - continue statin therapy.   Medication Adjustments/Labs and Tests Ordered: Current medicines are reviewed at length with the patient today.  Concerns regarding medicines are outlined above.  Medication changes, Labs and Tests ordered today are listed in the Patient Instructions below. Patient Instructions  Medications:  Your physician recommends that you continue on your current medications as directed. Please refer to the Current Medication list given to you today.   Follow-Up:  Your physician recommends that you schedule a follow-up appointment in: 6 months with Dr. Swaziland.  If you need a refill on your cardiac medications before your next appointment, please call your pharmacy.      Signed, Ellsworth Lennox, [place]  08/07/2016 2:56 PM    [facility] [address] [place], [address] [place], [place]  [address] Phone: [phone]; Fax: [phone]  [address] [place], [place]  [address] Phone: [phone]

## 2016-08-07 NOTE — Patient Instructions (Signed)
Medications:  Your physician recommends that you continue on your current medications as directed. Please refer to the Current Medication list given to you today.   Follow-Up:  Your physician recommends that you schedule a follow-up appointment in: 6 months with Dr. SwazilandJordan.  If you need a refill on your cardiac medications before your next appointment, please call your pharmacy.

## 2016-08-08 NOTE — Telephone Encounter (Signed)
error 

## 2017-02-09 ENCOUNTER — Other Ambulatory Visit: Payer: Self-pay

## 2017-02-09 DIAGNOSIS — I4891 Unspecified atrial fibrillation: Secondary | ICD-10-CM

## 2017-02-09 DIAGNOSIS — I5022 Chronic systolic (congestive) heart failure: Secondary | ICD-10-CM

## 2017-02-09 MED ORDER — ATORVASTATIN CALCIUM 40 MG PO TABS: 40.0000 mg | ORAL_TABLET | Freq: Every day | ORAL | 4 refills | Status: DC

## 2017-02-09 MED ORDER — LOSARTAN POTASSIUM 25 MG PO TABS: 25.0000 mg | ORAL_TABLET | Freq: Every day | ORAL | 1 refills | Status: DC

## 2017-02-09 MED ORDER — FUROSEMIDE 20 MG PO TABS: 20.0000 mg | ORAL_TABLET | Freq: Every day | ORAL | 1 refills | Status: DC

## 2017-02-09 MED ORDER — POTASSIUM CHLORIDE ER 10 MEQ PO TBCR: 10.0000 meq | EXTENDED_RELEASE_TABLET | Freq: Every day | ORAL | 1 refills | Status: DC

## 2017-03-17 ENCOUNTER — Encounter: Payer: Self-pay | Admitting: Nurse Practitioner

## 2017-03-17 ENCOUNTER — Ambulatory Visit (INDEPENDENT_AMBULATORY_CARE_PROVIDER_SITE_OTHER): Payer: BLUE CROSS/BLUE SHIELD | Admitting: Nurse Practitioner

## 2017-03-17 VITALS — BP 104/72 | HR 55 | Ht 60.0 in | Wt 184.8 lb

## 2017-03-17 DIAGNOSIS — E785 Hyperlipidemia, unspecified: Secondary | ICD-10-CM | POA: Diagnosis not present

## 2017-03-17 DIAGNOSIS — I2510 Atherosclerotic heart disease of native coronary artery without angina pectoris: Secondary | ICD-10-CM

## 2017-03-17 DIAGNOSIS — I480 Paroxysmal atrial fibrillation: Secondary | ICD-10-CM | POA: Diagnosis not present

## 2017-03-17 DIAGNOSIS — I255 Ischemic cardiomyopathy: Secondary | ICD-10-CM

## 2017-03-17 DIAGNOSIS — I10 Essential (primary) hypertension: Secondary | ICD-10-CM | POA: Diagnosis not present

## 2017-03-17 LAB — COMPREHENSIVE METABOLIC PANEL
ALBUMIN: 3.8 g/dL (ref 3.6–5.1)
ALK PHOS: 71 U/L (ref 33–130)
ALT: 16 U/L (ref 6–29)
AST: 17 U/L (ref 10–35)
BUN: 14 mg/dL (ref 7–25)
CHLORIDE: 107 mmol/L (ref 98–110)
CO2: 21 mmol/L (ref 20–31)
CREATININE: 0.6 mg/dL (ref 0.50–1.05)
Calcium: 8.9 mg/dL (ref 8.6–10.4)
Glucose, Bld: 96 mg/dL (ref 65–99)
POTASSIUM: 4.6 mmol/L (ref 3.5–5.3)
Sodium: 140 mmol/L (ref 135–146)
TOTAL PROTEIN: 7.3 g/dL (ref 6.1–8.1)
Total Bilirubin: 0.5 mg/dL (ref 0.2–1.2)

## 2017-03-17 LAB — LIPID PANEL
CHOLESTEROL: 140 mg/dL (ref <200)
HDL: 54 mg/dL (ref >50)
LDL Cholesterol: 75 mg/dL (ref <100)
TRIGLYCERIDES: 56 mg/dL (ref <150)
Total CHOL/HDL Ratio: 2.6 Ratio (ref <5.0)
VLDL: 11 mg/dL (ref <30)

## 2017-03-17 NOTE — Patient Instructions (Signed)
Lab today ( cmet,lipid panel )     Your physician wants you to follow-up in: 6 months with Dr.Jordan. You will receive a reminder letter in the mail two months in advance. If you don't receive a letter, please call our office to schedule the follow-up appointment.

## 2017-03-17 NOTE — Progress Notes (Signed)
Office Visit    Patient Name: Traci Choi Date of Encounter: 03/17/2017  Primary Care Provider:  Patient, No Pcp Per Primary Cardiologist:  P. SwazilandJordan, MD   Chief Complaint    56 year old female with a history of CAD status post LAD stenting in 2014, ischemic cardiomyopathy and systolic heart failure with subsequent normalization of LV function, hyperlipidemia, and paroxysmal atrial fibrillation, who presents for follow-up.  Past Medical History    Past Medical History:  Diagnosis Date  . Chronic systolic CHF (congestive heart failure) (HCC)    a. echo 07/08/13: EF 35-40%, inferior, septal and apical HK;  b. 07/2016 [place] EF 55-65%, no signifiicant valvular abnormalities.  Marland Kitchen. COPD (chronic obstructive pulmonary disease) (HCC)    [facility]  . Coronary artery disease    a. Anterior STEMI (8/14):  LHC - pLAD occluded (PCI: Promus premier 3x20 mm DES to), EF 35-40%;  b. Re-look cath (9/14):  PLAD stent ok, D2 50-70 (small)->med Rx c. cath 03/2016 patent pLAD stent, 50% D1, 50% OM2->Med Rx;  d. 07/2016 MV ([place]): large ant/apical defect w/ peri-inf ischemia->Med rx.  Marland Kitchen. GERD (gastroesophageal reflux disease)   . Hyperlipidemia   . Ischemic cardiomyopathy    a. 07/08/13 Echo: EF 35-40%, inferior, septal and apical HK;  b. 07/2016 Echo ([place] EF 55-65%.  Marland Kitchen. PAD (peripheral artery disease) (HCC)    a. 01/2014 ABI's ([facility]): normal.  . PAF (paroxysmal atrial fibrillation) (HCC)    a. 06/2013 post MI => Amiodarone x 1 month-->not on long term OAC (CHA2DS2VASc = 3).   Past Surgical History:  Procedure Laterality Date  . ABDOMINAL AORTA STENT    . LEFT HEART CATHETERIZATION WITH CORONARY ANGIOGRAM Bilateral 07/08/2013   Procedure: LEFT HEART CATHETERIZATION WITH CORONARY ANGIOGRAM;  Surgeon: Peter M SwazilandJordan, MD;  [facility];  Service: Cardiovascular;  Laterality: Bilateral;  . LEFT HEART CATHETERIZATION WITH CORONARY ANGIOGRAM N/A 07/12/2013   Procedure: LEFT HEART CATHETERIZATION WITH CORONARY ANGIOGRAM;  Surgeon: Peter M SwazilandJordan, MD;  [facility];  Service: Cardiovascular;  Laterality: N/A;  . PERCUTANEOUS STENT INTERVENTION  07/08/2013   Procedure: PERCUTANEOUS STENT INTERVENTION;  Surgeon: Peter M SwazilandJordan, MD;  [facility];  Service: Cardiovascular;;  Prox LAD DES    Allergies  Allergies  Allergen Reactions  . Flagyl [Metronidazole] Other (See Comments)    unknown unknown  . Sulfa Antibiotics Hives  . Codeine Other (See Comments)    Other reaction(s): Other (see comments) Other Reaction: GI Upset Other Reaction: GI Upset  . Other Rash and Other (See Comments)  . Penicillins Nausea And Vomiting and Rash   History of Present Illness    56 year old female with the above complex past medical history including coronary artery disease status post anterior myocardial infarction in August 2014 requiring drug-eluting stent placement to the LAD. She has subsequently undergone relook catheterization on 2 occasions, in September 2014 and again in May 2017. On both occasions, the proximal LAD stent was patent with moderate diagonal disease noted. She underwent stress testing in [place] in September 2017 due to recurrent chest discomfort, which showed a large, severe partially reversible defect involving the mid-apical anterior, anteroseptal, apical inferior, apical septal, and apical walls. This was felt be consistent with prior infarction and peri-infarct ischemia. The therapy was recommended given recent prior catheterization showing patent LAD stent. Other history includes hyperlipidemia, paroxysmal atrial fibrillation at the time of her MI in 2014, and ischemic cardiomyopathy with  subsequent normalization of LV function by echo in September 2017, showing an EF of 55-65%.  She was last seen in clinic in September 2017, at which time she was doing well. She continues to do well although notes that her exercise  has fallen off in the past 2-3 months. In that setting, she is up 6 or 7 pounds. She does have some degree of chronic dyspnea on exertion with higher levels of activity and this is overall unchanged. She denies chest pain, PND, orthopnea, palpitations, dizziness, syncope, edema, or early satiety.  Home Medications    Prior to Admission medications   Medication Sig Start Date End Date Taking? Authorizing Provider  albuterol (PROVENTIL HFA;VENTOLIN HFA) 108 (90 BASE) MCG/ACT inhaler Inhale 2 puffs into the lungs every 6 (six) hours as needed for wheezing.    [provider]  aspirin 81 MG chewable tablet Chew 1 tablet (81 mg total) by mouth daily. 07/13/13   Barrett, Joline Salt, PA-C  atorvastatin (LIPITOR) 40 MG tablet Take 1 tablet (40 mg total) by mouth daily. 02/09/17 03/11/17  Swaziland, Peter M, MD  benzonatate (TESSALON) 100 MG capsule Take 100 mg by mouth as needed for cough.  07/25/13   [provider]  bisoprolol (ZEBETA) 5 MG tablet Take 0.5 tablets (2.5 mg total) by mouth daily. 02/14/16   Swaziland, Peter M, MD  fluticasone [facility]) 50 MCG/ACT nasal spray Place 2 sprays into both nostrils daily as needed for allergies.    [provider]  furosemide (LASIX) 20 MG tablet Take 1 tablet (20 mg total) by mouth daily. 02/09/17   Swaziland, Peter M, MD  losartan (COZAAR) 25 MG tablet Take 1 tablet (25 mg total) by mouth daily. 02/09/17   Swaziland, Peter M, MD  nitroGLYCERIN (NITROSTAT) 0.4 MG SL tablet Place 0.4 mg under the tongue every 5 (five) minutes as needed for chest pain. 3 DOSES MAX    [provider]  pantoprazole (PROTONIX) 40 MG tablet Take 1 tablet (40 mg total) by mouth daily. 04/02/16   Swaziland, Peter M, MD  potassium chloride (K-DUR) 10 MEQ tablet Take 1 tablet (10 mEq total) by mouth daily. 02/09/17   Swaziland, Peter M, MD  traMADol (ULTRAM) 50 MG tablet Take 50 mg by mouth every 6 (six) hours as needed for moderate pain.  07/24/16   [provider]    Review of  Systems    She has some degree of chronic dyspnea on exertion. This is unchanged. She otherwise denies chest pain, palpitations, PND, orthopnea, dizziness, syncope, edema, or early satiety.  All other systems reviewed and are otherwise negative except as noted above.  Physical Exam    VS:  BP 104/72   Pulse (!) 55   Ht 5' (1.524 m)   Wt 184 lb 12.8 oz (83.8 kg)   BMI 36.09 kg/m  , BMI Body mass index is 36.09 kg/m. GEN: Well nourished, well developed, in no acute distress.  HEENT: normal.  Neck: Supple, no JVD, carotid bruits, or masses. Cardiac: RRR, no murmurs, rubs, or gallops. No clubbing, cyanosis, edema.  Radials/DP/PT 2+ and equal bilaterally.  Respiratory:  Respirations regular and unlabored, clear to auscultation bilaterally. GI: Soft, nontender, nondistended, BS + x 4. MS: no deformity or atrophy. Skin: warm and dry, no rash. Neuro:  Strength and sensation are intact. Psych: Normal affect.  Accessory Clinical Findings    Lipids and LFTs pending.   Assessment & Plan    1.  Coronary artery disease: Status  post prior anterior myocardial infarction with LAD stenting in 2014. She is subsequently undergone catheterization on 2 occasions, last in May 2017 revealing patent LAD stent and nonobstructive diagonal disease. She had an intermediate risk Myoview in September 2017 in [place] with evidence of anterior and apical infarct and peri-infarct ischemia. She's done well since then without chest pain. She has some degree of chronic dyspnea on exertion which is unchanged. Overall, she feels as though she is stable. Remains on aspirin, statin, beta blocker, and ARB therapy.    2. Ischemic cardiomyopathy/history of systolic heart failure: EF previously as low as 35-40% in August 2014 following a myocardial infarction. Echo in September 2017 showed normal LV function. She is not having any significant volume issues at home and is euvolemic today. She remains on beta blocker  and ARB therapy.  3. Essential hypertension: Blood pressure stable on beta blocker, ARB, and Lasix.  4. Hyperlipidemia: She is currently on Lipitor 40 mg. She had previously had memory issues and it was held in the past. Her LDL in February 2017 was 141 and atorvastatin was resumed. She has not had lipids checked since then. She is fasting today. I will check lipids and LFTs today.  5. Paroxysmal atrial fibrillation: This occurred in the setting of her myocardial infarction in 2014. No recent palpitations. Continue beta blocker and aspirin therapy.  6. Disposition: Follow-up lipids and LFTs today. She will follow-up with Dr. Swaziland in 6 months or sooner if necessary.  Nicolasa Ducking, NP 03/17/2017, 9:31 AM

## 2017-03-19 ENCOUNTER — Other Ambulatory Visit: Payer: Self-pay | Admitting: *Deleted

## 2017-03-19 MED ORDER — BISOPROLOL FUMARATE 5 MG PO TABS: 2.5000 mg | ORAL_TABLET | Freq: Every day | ORAL | 3 refills | Status: DC

## 2017-04-13 ENCOUNTER — Other Ambulatory Visit: Payer: Self-pay | Admitting: *Deleted

## 2017-04-13 MED ORDER — PANTOPRAZOLE SODIUM 40 MG PO TBEC: 40.0000 mg | DELAYED_RELEASE_TABLET | Freq: Every day | ORAL | 11 refills | Status: DC

## 2017-05-15 ENCOUNTER — Telehealth: Payer: Self-pay | Admitting: Cardiology

## 2017-05-15 NOTE — Telephone Encounter (Signed)
Returned call to patient.She stated she will be bringing paperwork from work for Dr.Jordan to complete on 05/18/17.

## 2017-05-15 NOTE — Telephone Encounter (Signed)
Spoke with pt, she would prefer to speak with cheryl. Will forward message

## 2017-05-15 NOTE — Telephone Encounter (Signed)
New Message  Pt call requesting tosp eak with RN . Pt states she will wait for a call back to discuss. Please call back to discuss

## 2017-06-08 ENCOUNTER — Telehealth: Payer: Self-pay | Admitting: Cardiology

## 2017-06-08 NOTE — Telephone Encounter (Signed)
Returned call to patient. [facility]. Informed her that she is out of the office today but I will send her a message to call her back

## 2017-06-08 NOTE — Telephone Encounter (Signed)
New message   Patient is calling about the paperwork you have for her , patient would like to speak with Elnita Maxwellheryl

## 2017-06-09 NOTE — Telephone Encounter (Signed)
Returned call to patient.[employer] form faxed to her at fax # [phone].

## 2017-06-09 NOTE — Telephone Encounter (Signed)
Follow  Up     Patient returning calling would like to speak with Dr. SwazilandJordan nurse Elnita Maxwellheryl.   Patient is aware that Nurse is in clinic today.

## 2017-06-09 NOTE — Telephone Encounter (Signed)
New Message ° ° pt verbalized that she is returning call for rn °

## 2017-07-29 ENCOUNTER — Other Ambulatory Visit: Payer: Self-pay | Admitting: Cardiology

## 2017-08-08 ENCOUNTER — Other Ambulatory Visit: Payer: Self-pay | Admitting: Cardiology

## 2017-08-08 DIAGNOSIS — I5022 Chronic systolic (congestive) heart failure: Secondary | ICD-10-CM

## 2017-08-10 NOTE — Telephone Encounter (Signed)
REFILL 

## 2017-08-11 ENCOUNTER — Other Ambulatory Visit: Payer: Self-pay | Admitting: Cardiology

## 2017-08-11 DIAGNOSIS — I4891 Unspecified atrial fibrillation: Secondary | ICD-10-CM

## 2017-08-28 ENCOUNTER — Encounter: Payer: Self-pay | Admitting: Cardiology

## 2017-09-09 NOTE — Progress Notes (Signed)
Traci Choi Date of Birth: 04/23/1961 Medical Record #[id number]  History of Present Illness: Traci Choi is seen today for followup CAD. She has a history of coronary disease, peripheral vascular disease, and COPD. She was admitted in August 2014 with an anterior STEMI. She had emergent stenting of the proximal LAD with DES. She had 70-80% disease in a small OM2 and PDA. Treated medically.  Ejection fraction was 35-40% at that time. Because of recurrent chest pain ECG changes she underwent repeat angiography during that hospitalization which showed continued stent patency. She did develop atrial fibrillation and was treated with amiodarone but this has since been discontinued. Follow up Echo in Dec. 2014 showed EF 40-45%. In March 2017 she had a Myoview study that showed fixed defects in the mid apical anterior and apical inferior walls. No ischemia. EF 55%.   In May 2017 she went to the hospital in [place] with recurrent chest and throat pain. States she had constant pain for 3 days. Improved with Ntg. She ruled out for MI. LDL was 81. CXR was normal. Cardiac cath was performed showing a patent stent in the LAD. There was 40-50% stenosis in first diagonal, second OM and PDA. She was treated for GERD and had resolution of her chest pain.  She continues to note memory loss. We stopped her lipitor to see if this was a factor but she noted no change. She lipitor was resumed.   She was  admitted at [facility] on 07/17/2016 for chest pain. She had negative troponin values. Echo showing normal echo with EF of 55-65% and no wall motion abnormalities. No significant valve abnormalities. She underwent a NST which showed a large, severe partially reversible defect involving the mid-apical anterior, anteroseptal, apical inferior, apical septal, and apical walls, thought to be consistent with previous infarction and peri-infarct ischemia. Overall interrpreted as moderate-risk. Images were not available  for review. She was seen back here in September 2017 and May 2018 and was doing well on medical therapy.   She is [facility] at [facility] COPD. Last PFTs in May 2018 showed worsening function and she was placed on Anoro.   On follow up today she has rare anginal symptoms. The last episode occurred one month ago and was relieved with sl Ntg. Admits she has not been following diet or exercise recommendations. She does report she had a sleep study that was negative for sleep apnea.  Current Outpatient Medications on File Prior to Visit  Medication Sig Dispense Refill  . albuterol (PROVENTIL HFA;VENTOLIN HFA) 108 (90 BASE) MCG/ACT inhaler Inhale 2 puffs into the lungs every 6 (six) hours as needed for wheezing.    Marland Kitchen. aspirin 81 MG chewable tablet Chew 1 tablet (81 mg total) by mouth daily.    Marland Kitchen. atorvastatin (LIPITOR) 40 MG tablet TAKE 1 TABLET BY MOUTH ONCE DAILY. 90 tablet 0  . benzonatate (TESSALON) 100 MG capsule Take 100 mg by mouth as needed for cough.     . bisoprolol (ZEBETA) 5 MG tablet Take 0.5 tablets (2.5 mg total) by mouth daily. 90 tablet 3  . fluticasone (FLONASE) 50 MCG/ACT nasal spray Place 2 sprays into both nostrils daily as needed for allergies.    . furosemide (LASIX) 20 MG tablet TAKE 1 TABLET BY MOUTH ONCE DAILY. 90 tablet 0  . losartan (COZAAR) 25 MG tablet TAKE 1 TABLET BY MOUTH ONCE DAILY. 90 tablet 0  . nitroGLYCERIN (NITROSTAT) 0.4 MG SL tablet Place 0.4 mg under the tongue every  5 (five) minutes as needed for chest pain. 3 DOSES MAX    . pantoprazole (PROTONIX) 40 MG tablet Take 1 tablet (40 mg total) by mouth daily. 30 tablet 11  . potassium chloride (K-DUR) 10 MEQ tablet TAKE 1 TABLET BY MOUTH ONCE DAILY. 90 tablet 0  . traMADol (ULTRAM) 50 MG tablet Take 50 mg by mouth every 6 (six) hours as needed for moderate pain.      No current facility-administered medications on file prior to visit.     Allergies  Allergen Reactions  . Metronidazole Rash  . Sulfa Antibiotics  Hives  . Codeine Other (See Comments)    Other Reaction: GI Upset   . Penicillins Nausea And Vomiting and Rash    Past Medical History:  Diagnosis Date  . Chronic systolic CHF (congestive heart failure) (HCC)    a. echo 07/08/13: EF 35-40%, inferior, septal and apical HK;  b. 07/2016 Echo ([place], [place]): EF 55-65%, no signifiicant valvular abnormalities.  Marland Kitchen COPD (chronic obstructive pulmonary disease) (HCC)    followed at [facility]  . Coronary artery disease    a. Anterior STEMI (8/14):  LHC - pLAD occluded (PCI: Promus premier 3x20 mm DES to), EF 35-40%;  b. Re-look cath (9/14):  PLAD stent ok, D2 50-70 (small)->med Rx c. cath 03/2016 patent pLAD stent, 50% D1, 50% OM2->Med Rx;  d. 07/2016 MV ([place]): large ant/apical defect w/ peri-inf ischemia->Med rx.  Marland Kitchen GERD (gastroesophageal reflux disease)   . Hyperlipidemia   . Ischemic cardiomyopathy    a. 07/08/13 Echo: EF 35-40%, inferior, septal and apical HK;  b. 07/2016 Echo ([place], [place]): EF 55-65%.  Marland Kitchen PAD (peripheral artery disease) (HCC)    a. 01/2014 ABI's ([facility]): normal.  . PAF (paroxysmal atrial fibrillation) (HCC)    a. 06/2013 post MI => Amiodarone x 1 month-->not on long term OAC (CHA2DS2VASc = 3).    Past Surgical History:  Procedure Laterality Date  . ABDOMINAL AORTA STENT      Social History   Tobacco Use  Smoking Status Never Smoker  Smokeless Tobacco Former Neurosurgeon    Social History   Substance and Sexual Activity  Alcohol Use No    Family History  Problem Relation Age of Onset  . Heart attack Mother     Review of Systems: As noted in history of present illness.  All other systems were reviewed and are negative.  Physical Exam: BP 124/62   Pulse 67   Ht 5' (1.524 m)   Wt 188 lb (85.3 kg)   SpO2 96%   BMI 36.72 kg/m  GENERAL:  Well appearing BF in NAD HEENT:  PERRL, EOMI, sclera are clear. Oropharynx is clear. NECK:  No jugular venous distention, carotid upstroke brisk and symmetric, no bruits, no  thyromegaly or adenopathy LUNGS:  Clear to auscultation bilaterally CHEST:  Unremarkable HEART:  RRR,  PMI not displaced or sustained,S1 and S2 within normal limits, no S3, no S4: no clicks, no rubs, no murmurs ABD:  Soft, nontender. BS +, no masses or bruits. No hepatomegaly, no splenomegaly EXT:  2 + pulses throughout, no edema, no cyanosis no clubbing SKIN:  Warm and dry.  No rashes NEURO:  Alert and oriented x 3. Cranial nerves II through XII intact. PSYCH:  Cognitively intact    LABORATORY DATA: Lab Results  Component Value Date   WBC 8.9 07/28/2013   HGB 12.9 07/28/2013   HCT 38.9 07/28/2013   PLT 299.0 07/28/2013   GLUCOSE 96 03/17/2017  CHOL 140 03/17/2017   TRIG 56 03/17/2017   HDL 54 03/17/2017   LDLCALC 75 03/17/2017   ALT 16 03/17/2017   AST 17 03/17/2017   NA 140 03/17/2017   K 4.6 03/17/2017   CL 107 03/17/2017   CREATININE 0.60 03/17/2017   BUN 14 03/17/2017   CO2 21 03/17/2017   TSH 1.451 07/08/2013   INR 1.00 07/08/2013   HGBA1C 6.1 (H) 07/08/2013   Ecg today shows NSR with rate 67. 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 I have personally reviewed and interpreted this study.   Assessment / Plan: 1. Coronary disease status post anterior STEMI in 2014. Status post stenting of the proximal LAD with drug-eluting stent in 2014.  Continue ASA. She had some atypical chest pain. Myoview study in March 2017showed no ischemia with some scar. EF 55%. Cardiac cath in May 2017 in [facility] showed no obstructive disease. Repeat Myoview there in September showed mostly scar. She has stable class 1-2 angina. Continue medical therapy with prn Ntg. Focus on lifestyle modification with heart healthy diet, weight loss, and exercise.   2. Ischemic cardiomyopathy. EF improved to 55% by Myoview. Also normal by last Cath. Stressed importance of sodium restriction. She may take extra lasix as needed for weight gain. She appears euvolemic today.  3. Atrial fibrillation. No recurrence.    4.  Memory loss. Followed by Neuro.   6. PAD. Status post abdominal aortic stenting in the past. Last ABIs at [facility] 1/15 were normal.   7. Hypercholesterolemia. LDL improved to 75 on lipitor. Continue current therapy.  8. COPD   I will follow up in 6 months

## 2017-09-14 ENCOUNTER — Ambulatory Visit (INDEPENDENT_AMBULATORY_CARE_PROVIDER_SITE_OTHER): Payer: BLUE CROSS/BLUE SHIELD | Admitting: Cardiology

## 2017-09-14 ENCOUNTER — Encounter: Payer: Self-pay | Admitting: Cardiology

## 2017-09-14 VITALS — BP 124/62 | HR 67 | Ht 60.0 in | Wt 188.0 lb

## 2017-09-14 DIAGNOSIS — I25119 Atherosclerotic heart disease of native coronary artery with unspecified angina pectoris: Secondary | ICD-10-CM | POA: Diagnosis not present

## 2017-09-14 DIAGNOSIS — I5022 Chronic systolic (congestive) heart failure: Secondary | ICD-10-CM | POA: Diagnosis not present

## 2017-09-14 DIAGNOSIS — I10 Essential (primary) hypertension: Secondary | ICD-10-CM | POA: Diagnosis not present

## 2017-09-14 DIAGNOSIS — I255 Ischemic cardiomyopathy: Secondary | ICD-10-CM | POA: Diagnosis not present

## 2017-09-14 DIAGNOSIS — I739 Peripheral vascular disease, unspecified: Secondary | ICD-10-CM

## 2017-09-14 MED ORDER — UMECLIDINIUM-VILANTEROL 62.5-25 MCG/INH IN AEPB: 1.0000 | INHALATION_SPRAY | Freq: Every day | RESPIRATORY_TRACT | Status: DC

## 2017-09-14 NOTE — Patient Instructions (Signed)
Continue your current therapy  Work on getting regular aerobic exercise, weight loss and eating healthy  I will see you in 6 months.

## 2017-10-22 IMAGING — NM NM MISC PROCEDURE
6 series · 36 of 36 positions shown · non-contrast
Comparison: none

[Series 1: wbr_r-proj_st wbr rest · 6.40mm/px · 6 of 64 frames shown]
[frame 6/64]
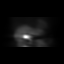
[frame 16/64]
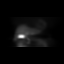
[frame 27/64]
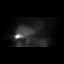
[frame 38/64]
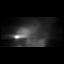
[frame 48/64]
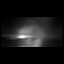
[frame 59/64]
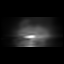

[Series 1: wbr rest · 6.40mm/px · 6 of 64 frames shown]
[frame 6/64]
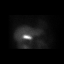
[frame 16/64]
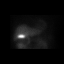
[frame 27/64]
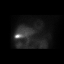
[frame 38/64]
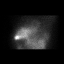
[frame 48/64]
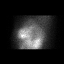
[frame 59/64]
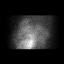

[Series 2: wbr_s-proj_st wbr stress-gsp · 6.40mm/px · 6 of 512 frames shown]
[frame 43/512]
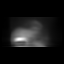
[frame 128/512]
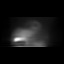
[frame 214/512]
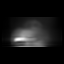
[frame 299/512]
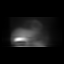
[frame 384/512]
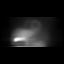
[frame 470/512]
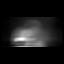

[Series 2: wbr stress-gsp · 6.40mm/px · 6 of 512 frames shown]
[frame 43/512]
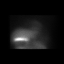
[frame 128/512]
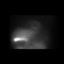
[frame 214/512]
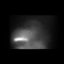
[frame 299/512]
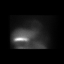
[frame 384/512]
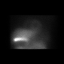
[frame 470/512]
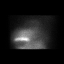

[Series 3: wbr_s-proj_st wbr stress-sum-em · 6.40mm/px · 6 of 64 frames shown]
[frame 6/64]
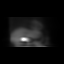
[frame 16/64]
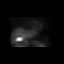
[frame 27/64]
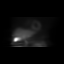
[frame 38/64]
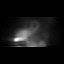
[frame 48/64]
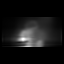
[frame 59/64]
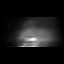

[Series 3: wbr stress-sum-em · 6.40mm/px · 6 of 64 frames shown]
[frame 6/64]
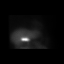
[frame 16/64]
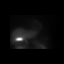
[frame 27/64]
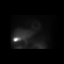
[frame 38/64]
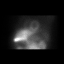
[frame 48/64]
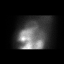
[frame 59/64]
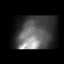

[36 of 36 positions shown; findings below may reference images not displayed]

Canned report from images found in remote index.

Refer to host system for actual result text.

## 2017-11-07 ENCOUNTER — Other Ambulatory Visit: Payer: Self-pay | Admitting: Cardiology

## 2017-11-07 DIAGNOSIS — I4891 Unspecified atrial fibrillation: Secondary | ICD-10-CM

## 2017-11-07 DIAGNOSIS — I5022 Chronic systolic (congestive) heart failure: Secondary | ICD-10-CM

## 2018-03-22 ENCOUNTER — Other Ambulatory Visit: Payer: Self-pay | Admitting: Cardiology

## 2018-03-25 NOTE — Progress Notes (Signed)
Traci Choi Date of Birth: 02/21/1961 Medical Record #[id number]  History of Present Illness: [name] is seen today for followup CAD. She has a history of coronary disease, peripheral vascular disease, and COPD. She was admitted in August 2014 with an anterior STEMI. She had emergent stenting of the proximal LAD with DES. She had 70-80% disease in a small OM2 and PDA. Treated medically.  Ejection fraction was 35-40% at that time. Because of recurrent chest pain ECG changes she underwent repeat angiography during that hospitalization which showed continued stent patency. She did develop atrial fibrillation and was treated with amiodarone but this has since been discontinued. Follow up Echo in Dec. 2014 showed EF 40-45%. In March 2017 she had a Myoview study that showed fixed defects in the mid apical anterior and apical inferior walls. No ischemia. EF 55%.   In May 2017 she went to the hospital in [place] with recurrent chest and throat pain. States she had constant pain for 3 days. Improved with Ntg. She ruled out for MI. LDL was 81. CXR was normal. Cardiac cath was performed showing a patent stent in the LAD. There was 40-50% stenosis in first diagonal, second OM and PDA. She was treated for GERD and had resolution of her chest pain.  She continues to note memory loss. We stopped her lipitor to see if this was a factor but she noted no change. Lipitor was resumed.   She was  admitted at [facility] on 07/17/2016 for chest pain. She had negative troponin values. Echo showing normal echo with EF of 55-65% and no wall motion abnormalities. No significant valve abnormalities. She underwent a NST which showed a large, severe partially reversible defect involving the mid-apical anterior, anteroseptal, apical inferior, apical septal, and apical walls, thought to be consistent with previous infarction and peri-infarct ischemia. Overall interrpreted as moderate-risk. Images were not available for  review. She was seen back here in September 2017 and May 2018 and was doing well on medical therapy.   She is followed at [facility] for COPD. Last PFTs in May 2018 showed worsening function and she was placed on Anoro.   On follow up today she denies any anginal symptoms. She does note SOB when she walks and talks at the same time. She does walk on a treadmill 2-3 times/wk. Mild intermittent swelling in her feet.  Current Outpatient Medications on File Prior to Visit  Medication Sig Dispense Refill  . aspirin 81 MG chewable tablet Chew 1 tablet (81 mg total) by mouth daily.    Marland Kitchen atorvastatin (LIPITOR) 40 MG tablet TAKE 1 TABLET BY MOUTH ONCE DAILY. 90 tablet 3  . benzonatate (TESSALON) 100 MG capsule Take 100 mg by mouth as needed for cough.     . bisoprolol (ZEBETA) 5 MG tablet Take 0.5 tablets (2.5 mg total) by mouth daily. 90 tablet 3  . bisoprolol (ZEBETA) 5 MG tablet TAKE 1/2 TABLET BY MOUTH DAILY 15 tablet 0  . furosemide (LASIX) 20 MG tablet TAKE 1 TABLET BY MOUTH ONCE DAILY. 90 tablet 3  . losartan (COZAAR) 25 MG tablet TAKE 1 TABLET BY MOUTH ONCE DAILY. 90 tablet 3  . nitroGLYCERIN (NITROSTAT) 0.4 MG SL tablet Place 0.4 mg under the tongue every 5 (five) minutes as needed for chest pain. 3 DOSES MAX    . pantoprazole (PROTONIX) 40 MG tablet Take 1 tablet (40 mg total) by mouth daily. 30 tablet 11  . potassium chloride (K-DUR) 10 MEQ tablet TAKE 1 TABLET  BY MOUTH ONCE DAILY. 90 tablet 3  . traMADol (ULTRAM) 50 MG tablet Take 50 mg by mouth every 6 (six) hours as needed for moderate pain.      No current facility-administered medications on file prior to visit.     Allergies  Allergen Reactions  . Metronidazole Rash  . Sulfa Antibiotics Hives  . Codeine Other (See Comments)    Other Reaction: GI Upset   . Penicillins Nausea And Vomiting and Rash    Past Medical History:  Diagnosis Date  . Chronic systolic CHF (congestive heart failure) (HCC)    a. echo 07/08/13: EF 35-40%,  inferior, septal and apical HK;  b. 07/2016 Echo ([place], [place]): EF 55-65%, no signifiicant valvular abnormalities.  Marland Kitchen COPD (chronic obstructive pulmonary disease) (HCC)    follow[facility] at [facility]  . Coronary artery disease    a. Anterior STEMI (8/14):  LHC - pLAD occluded (PCI: Promus premier 3x20 mm DES to), EF 35-40%;  b. Re-look cath (9/14):  PLAD stent ok, D2 50-70 (small)->med Rx c. cath 03/2016 patent pLAD stent, 50% D1, 50% OM2->Med Rx;  d. 07/2016 MV ([place]): large ant/apical defect w/ peri-inf ischemia->Med rx.  Marland Kitchen GERD (gastroesophageal reflux disease)   . Hyperlipidemia   . Ischemic cardiomyopathy    a. 07/08/13 Echo: EF 35-40%, inferior, septal and apical HK;  b. 07/2016 Echo ([place], [place]): EF 55-65%.  Marland Kitchen PAD (peripheral artery disease) (HCC)    a. 01/2014 ABI's ([facility]): normal.  . PAF (paroxysmal atrial fibrillation) (HCC)    a. 06/2013 post MI => Amiodarone x 1 month-->not on long term OAC (CHA2DS2VASc = 3).    Past Surgical History:  Procedure Laterality Date  . ABDOMINAL AORTA STENT    . LEFT HEART CATHETERIZATION WITH CORONARY ANGIOGRAM Bilateral 07/08/2013   Procedure: LEFT HEART CATHETERIZATION WITH CORONARY ANGIOGRAM;  Surgeon: Nolyn Swab M [place], MD;  Location: [facility] CATH LAB;  Service: Cardiovascular;  Laterality: Bilateral;  . LEFT HEART CATHETERIZATION WITH CORONARY ANGIOGRAM N/A 07/12/2013   Procedure: LEFT HEART CATHETERIZATION WITH CORONARY ANGIOGRAM;  Surgeon: Maebelle Sulton M [place], MD;  Location: [facility] CATH LAB;  Service: Cardiovascular;  Laterality: N/A;  . PERCUTANEOUS STENT INTERVENTION  07/08/2013   Procedure: PERCUTANEOUS STENT INTERVENTION;  Surgeon: Randell Teare M [place], MD;  Location: [facility] CATH LAB;  Service: Cardiovascular;;  Prox LAD DES    Social History   Tobacco Use  Smoking Status Never Smoker  Smokeless Tobacco Former Neurosurgeon    Social History   Substance and Sexual Activity  Alcohol Use No    Family History  Problem Relation Age of Onset  . Heart attack Mother      Review of Systems: As noted in history of present illness.  All other systems were reviewed and are negative.  Physical Exam: BP 104/64   Pulse 72   Ht 5' (1.524 m)   Wt 188 lb (85.3 kg)   BMI 36.72 kg/m  GENERAL:  Well appearing BF in NAD HEENT:  PERRL, EOMI, sclera are clear. Oropharynx is clear. NECK:  No jugular venous distention, carotid upstroke brisk and symmetric, no bruits, no thyromegaly or adenopathy LUNGS:  Clear to auscultation bilaterally CHEST:  Unremarkable HEART:  RRR,  PMI not displaced or sustained,S1 and S2 within normal limits, no S3, no S4: no clicks, no rubs, no murmurs ABD:  Soft, nontender. BS +, no masses or bruits. No hepatomegaly, no splenomegaly EXT:  2 + pulses throughout, no edema, no cyanosis no clubbing SKIN:  Warm and dry.  No rashes NEURO:  Alert and oriented x 3. Cranial nerves II through XII intact. PSYCH:  Cognitively intact      LABORATORY DATA: Lab Results  Component Value Date   WBC 8.9 07/28/2013   HGB 12.9 07/28/2013   HCT 38.9 07/28/2013   PLT 299.0 07/28/2013   GLUCOSE 96 03/17/2017   CHOL 140 03/17/2017   TRIG 56 03/17/2017   HDL 54 03/17/2017   LDLCALC 75 03/17/2017   ALT 16 03/17/2017   AST 17 03/17/2017   NA 140 03/17/2017   K 4.6 03/17/2017   CL 107 03/17/2017   CREATININE 0.60 03/17/2017   BUN 14 03/17/2017   CO2 21 03/17/2017   TSH 1.451 07/08/2013   INR 1.00 07/08/2013   HGBA1C 6.1 (H) 07/08/2013     Assessment / Plan: 1. Coronary disease status post anterior STEMI in 2014. Status post stenting of the proximal LAD with drug-eluting stent in 2014.    Myoview study in March 2017showed no ischemia with some scar. EF 55%. Cardiac cath in May 2017 in [facility] showed no obstructive disease. Repeat Myoview there in September 2017 showed mostly scar. She has stable class 1 angina. Continue medical therapy with prn Ntg. Focus on lifestyle modification with heart healthy diet, weight loss, and exercise.    2. Ischemic cardiomyopathy. EF improved to 55% by Myoview. Also normal by last Cath. Stressed importance of sodium restriction.  She appears euvolemic today.  3. Atrial fibrillation. No recurrence.   4.  Memory loss. Seen previously by Neuro.   6. PAD. Status post abdominal aortic stenting in the past. Last ABIs at [facility] 1/15 were normal.   7. Hypercholesterolemia.  on lipitor. Continue current therapy. Will check lab work today.  8. COPD- inhalers renewed.   I will follow up in 6 months

## 2018-03-26 ENCOUNTER — Ambulatory Visit: Payer: Self-pay | Admitting: Cardiology

## 2018-03-30 ENCOUNTER — Other Ambulatory Visit: Payer: Self-pay

## 2018-03-30 ENCOUNTER — Encounter

## 2018-03-30 ENCOUNTER — Ambulatory Visit (INDEPENDENT_AMBULATORY_CARE_PROVIDER_SITE_OTHER): Payer: BLUE CROSS/BLUE SHIELD | Admitting: Cardiology

## 2018-03-30 ENCOUNTER — Encounter: Payer: Self-pay | Admitting: Cardiology

## 2018-03-30 VITALS — BP 104/64 | HR 72 | Ht 60.0 in | Wt 188.0 lb

## 2018-03-30 DIAGNOSIS — I25119 Atherosclerotic heart disease of native coronary artery with unspecified angina pectoris: Secondary | ICD-10-CM

## 2018-03-30 DIAGNOSIS — I10 Essential (primary) hypertension: Secondary | ICD-10-CM | POA: Diagnosis not present

## 2018-03-30 DIAGNOSIS — I5022 Chronic systolic (congestive) heart failure: Secondary | ICD-10-CM

## 2018-03-30 MED ORDER — UMECLIDINIUM-VILANTEROL 62.5-25 MCG/INH IN AEPB
1.0000 | INHALATION_SPRAY | Freq: Every day | RESPIRATORY_TRACT | Status: DC
Start: 2018-03-30 — End: 2018-03-30

## 2018-03-30 MED ORDER — FLUTICASONE PROPIONATE 50 MCG/ACT NA SUSP: 2.0000 | Freq: Every day | NASAL | 3 refills | Status: DC | PRN

## 2018-03-30 MED ORDER — ALBUTEROL SULFATE HFA 108 (90 BASE) MCG/ACT IN AERS: 2.0000 | INHALATION_SPRAY | Freq: Four times a day (QID) | RESPIRATORY_TRACT | 3 refills | Status: DC | PRN

## 2018-03-30 MED ORDER — UMECLIDINIUM-VILANTEROL 62.5-25 MCG/INH IN AEPB: 1.0000 | INHALATION_SPRAY | Freq: Every day | RESPIRATORY_TRACT | 1 refills | Status: DC

## 2018-03-30 NOTE — Patient Instructions (Addendum)
We will check blood work today  Continue your current therapy  I will see you in 6 months 

## 2018-03-31 LAB — BASIC METABOLIC PANEL
BUN/Creatinine Ratio: 24 — ABNORMAL HIGH (ref 9–23)
BUN: 16 mg/dL (ref 6–24)
CO2: 20 mmol/L (ref 20–29)
CREATININE: 0.66 mg/dL (ref 0.57–1.00)
Calcium: 8.8 mg/dL (ref 8.7–10.2)
Chloride: 107 mmol/L — ABNORMAL HIGH (ref 96–106)
GFR, EST AFRICAN AMERICAN: 113 mL/min/{1.73_m2} (ref >59)
GFR, EST NON AFRICAN AMERICAN: 98 mL/min/{1.73_m2} (ref >59)
Glucose: 105 mg/dL — ABNORMAL HIGH (ref 65–99)
Potassium: 4 mmol/L (ref 3.5–5.2)
SODIUM: 140 mmol/L (ref 134–144)

## 2018-03-31 LAB — HEPATIC FUNCTION PANEL
ALBUMIN: 4 g/dL (ref 3.5–5.5)
ALT: 20 IU/L (ref 0–32)
AST: 20 IU/L (ref 0–40)
Alkaline Phosphatase: 82 IU/L (ref 39–117)
BILIRUBIN, DIRECT: 0.12 mg/dL (ref 0.00–0.40)
Bilirubin Total: 0.4 mg/dL (ref 0.0–1.2)
TOTAL PROTEIN: 7.3 g/dL (ref 6.0–8.5)

## 2018-03-31 LAB — LIPID PANEL W/O CHOL/HDL RATIO
Cholesterol, Total: 149 mg/dL (ref 100–199)
HDL: 49 mg/dL (ref >39)
LDL Calculated: 81 mg/dL (ref 0–99)
TRIGLYCERIDES: 96 mg/dL (ref 0–149)
VLDL Cholesterol Cal: 19 mg/dL (ref 5–40)

## 2018-03-31 LAB — HEMOGLOBIN A1C
Est. average glucose Bld gHb Est-mCnc: 128 mg/dL
HEMOGLOBIN A1C: 6.1 % — AB (ref 4.8–5.6)

## 2018-04-01 ENCOUNTER — Other Ambulatory Visit: Payer: Self-pay

## 2018-04-20 ENCOUNTER — Other Ambulatory Visit: Payer: Self-pay | Admitting: Cardiology

## 2018-04-24 ENCOUNTER — Other Ambulatory Visit: Payer: Self-pay | Admitting: Cardiology

## 2018-04-26 ENCOUNTER — Telehealth: Payer: Self-pay | Admitting: Cardiology

## 2018-04-26 NOTE — Telephone Encounter (Signed)
New Message:        *STAT* If patient is at the pharmacy, call can be transferred to refill team.   1. Which medications need to be refilled? (please list name of each medication and dose if known) atorvastatin (LIPITOR) 80 MG tablet  2. Which pharmacy/location (including street and city if local pharmacy) is medication to be sent to[place] [place] [place] [address]  3. Do they need a 30 day or 90 day supply? 90

## 2018-04-27 MED ORDER — ATORVASTATIN CALCIUM 80 MG PO TABS: 80.0000 mg | ORAL_TABLET | Freq: Every day | ORAL | 3 refills | Status: DC

## 2018-04-27 NOTE — Telephone Encounter (Signed)
Refill Atorvastatin as requested

## 2018-04-27 NOTE — Addendum Note (Signed)
Addended by: Regis BillPRATT, Kadiatou Oplinger B on: 04/27/2018 04:37 PM   Modules accepted: Orders

## 2018-05-27 ENCOUNTER — Other Ambulatory Visit: Payer: Self-pay | Admitting: Cardiology

## 2018-05-27 NOTE — Telephone Encounter (Signed)
Rx request sent to pharmacy.  

## 2018-06-30 ENCOUNTER — Telehealth: Payer: Self-pay | Admitting: Cardiology

## 2018-06-30 NOTE — Telephone Encounter (Signed)
Returned call to patient,  Patient did not want to disclose information.  She request Traci Choi call back tomorrow (patient aware she is OOO today).

## 2018-06-30 NOTE — Telephone Encounter (Signed)
New Message:   Pt has a question about paper work /pt states the nurse will know

## 2018-07-01 NOTE — Telephone Encounter (Signed)
Returned call to patient she stated she will be bringing a form by for Dr.Jordan to sign.

## 2018-07-08 ENCOUNTER — Other Ambulatory Visit: Payer: Self-pay | Admitting: Cardiology

## 2018-07-08 NOTE — Telephone Encounter (Signed)
Rx sent to pharmacy   

## 2018-07-09 ENCOUNTER — Telehealth: Payer: Self-pay

## 2018-07-09 NOTE — Telephone Encounter (Signed)
Spoke to patient 07/08/18.[employer] form completed, faxed to [phone].[facility] front desk for pick up.

## 2018-10-26 NOTE — Progress Notes (Signed)
Traci Choi Date of Birth: 01/16/1961 Medical Record #[id number]  History of Present Illness: Traci Choi is seen today for followup CAD. She has a history of coronary disease, peripheral vascular disease, and COPD. She was admitted in August 2014 with an anterior STEMI. She had emergent stenting of the proximal LAD with DES. She had 70-80% disease in a small OM2 and PDA. Treated medically.  Ejection fraction was 35-40% at that time. Because of recurrent chest pain ECG changes she underwent repeat angiography during that hospitalization which showed continued stent patency. She did develop atrial fibrillation and was treated with amiodarone but this has since been discontinued. Follow up Echo in Dec. 2014 showed EF 40-45%. In March 2017 she had a Myoview study that showed fixed defects in the mid apical anterior and apical inferior walls. No ischemia. EF 55%.   In May 2017 she went to the hospital in [place] with recurrent chest and throat pain. States she had constant pain for 3 days. Improved with Ntg. She ruled out for MI. LDL was 81. CXR was normal. Cardiac cath was performed showing a patent stent in the LAD. There was 40-50% stenosis in first diagonal, second OM and PDA. She was treated for GERD and had resolution of her chest pain.  She continues to note memory loss. We stopped her lipitor to see if this was a factor but she noted no change. Lipitor was resumed.   She was  admitted at [facility] on 07/17/2016 for chest pain. She had negative troponin values. Echo showing normal echo with EF of 55-65% and no wall motion abnormalities. No significant valve abnormalities. She underwent a NST which showed a large, severe partially reversible defect involving the mid-apical anterior, anteroseptal, apical inferior, apical septal, and apical walls, thought to be consistent with previous infarction and peri-infarct ischemia. Overall interrpreted as moderate-risk. Images were not available for  review. She was seen back here in September 2017 and May 2018 and was doing well on medical therapy.   [facility] is [facility] COPD. Last PFTs in May 2018 showed worsening function and she was placed on Anoro. She has remote stenting of the distal abdominal aorta in the 1990s at [facility]. Has had no follow up there in about 5 years.   On follow up today she is doing well. She does note SOB at times but does pretty well most of the time. She describes chest pain about once a month relieved with sl Ntg. This is not getting worse.  She does walk on a treadmill 2 times/wk and is planning to do more.   Current Outpatient Medications on File Prior to Visit  Medication Sig Dispense Refill  . albuterol (PROVENTIL HFA;VENTOLIN HFA) 108 (90 Base) MCG/ACT inhaler Inhale 2 puffs into the lungs every 6 (six) hours as needed for wheezing. 1 Inhaler 3  . aspirin 81 MG chewable tablet Chew 1 tablet (81 mg total) by mouth daily.    Marland Kitchen. atorvastatin (LIPITOR) 80 MG tablet Take 1 tablet (80 mg total) by mouth daily. 90 tablet 3  . benzonatate (TESSALON) 100 MG capsule Take 100 mg by mouth as needed for cough.     . bisoprolol (ZEBETA) 5 MG tablet TAKE 1/2 TABLET BY MOUTH DAILY 15 tablet 6  . fluticasone (FLONASE) 50 MCG/ACT nasal spray Place 2 sprays into both nostrils daily as needed for allergies. 18.2 g 3  . furosemide (LASIX) 20 MG tablet TAKE 1 TABLET BY MOUTH ONCE DAILY. 90 tablet  3  . losartan (COZAAR) 25 MG tablet TAKE 1 TABLET BY MOUTH ONCE DAILY. 90 tablet 3  . nitroGLYCERIN (NITROSTAT) 0.4 MG SL tablet Place 0.4 mg under the tongue every 5 (five) minutes as needed for chest pain. 3 DOSES MAX    . pantoprazole (PROTONIX) 40 MG tablet TAKE 1 TABLET BY MOUTH ONCE DAILY 30 tablet 3  . potassium chloride (K-DUR) 10 MEQ tablet TAKE 1 TABLET BY MOUTH ONCE DAILY. 90 tablet 3  . traMADol (ULTRAM) 50 MG tablet Take 50 mg by mouth every 6 (six) hours as needed for moderate pain.     Marland Kitchen umeclidinium-vilanterol (ANORO  ELLIPTA) 62.5-25 MCG/INH AEPB Inhale 1 puff into the lungs daily. 14 each 1   No current facility-administered medications on file prior to visit.     Allergies  Allergen Reactions  . Metronidazole Rash  . Sulfa Antibiotics Hives  . Codeine Other (See Comments)    Other Reaction: GI Upset   . Penicillins Nausea And Vomiting and Rash    Past Medical History:  Diagnosis Date  . Chronic systolic CHF (congestive heart failure) (HCC)    a. echo 07/08/13: EF 35-40%, inferior, septal and apical HK;  b. 07/2016 Echo ([place], [place]): EF 55-65%, no signifiicant valvular abnormalities.  Marland Kitchen COPD (chronic obstructive pulmonary disease) (HCC)    followed at [facility]  . Coronary artery disease    a. Anterior STEMI (8/14):  LHC - pLAD occluded (PCI: Promus premier 3x20 mm DES to), EF 35-40%;  b. Re-look cath (9/14):  PLAD stent ok, D2 50-70 (small)->med Rx c. cath 03/2016 patent pLAD stent, 50% D1, 50% OM2->Med Rx;  d. 07/2016 MV ([place]): large ant/apical defect w/ peri-inf ischemia->Med rx.  Marland Kitchen GERD (gastroesophageal reflux disease)   . Hyperlipidemia   . Ischemic cardiomyopathy    a. 07/08/13 Echo: EF 35-40%, inferior, septal and apical HK;  b. 07/2016 Echo ([place], [place]): EF 55-65%.  Marland Kitchen PAD (peripheral artery disease) (HCC)    a. 01/2014 ABI's ([facility]): normal.  . PAF (paroxysmal atrial fibrillation) (HCC)    a. 06/2013 post MI => Amiodarone x 1 month-->not on long term OAC (CHA2DS2VASc = 3).    Past Surgical History:  Procedure Laterality Date  . ABDOMINAL AORTA STENT    . LEFT HEART CATHETERIZATION WITH CORONARY ANGIOGRAM Bilateral 07/08/2013   Procedure: LEFT HEART CATHETERIZATION WITH CORONARY ANGIOGRAM;  Surgeon: Mahlani Berninger M [place], MD;  Location: [facility] CATH LAB;  Service: Cardiovascular;  Laterality: Bilateral;  . LEFT HEART CATHETERIZATION WITH CORONARY ANGIOGRAM N/A 07/12/2013   Procedure: LEFT HEART CATHETERIZATION WITH CORONARY ANGIOGRAM;  Surgeon: Aime Meloche M [place], MD;  Location: [facility] CATH LAB;   Service: Cardiovascular;  Laterality: N/A;  . PERCUTANEOUS STENT INTERVENTION  07/08/2013   Procedure: PERCUTANEOUS STENT INTERVENTION;  Surgeon: Kohana Amble M [place], MD;  Location: [facility] CATH LAB;  Service: Cardiovascular;;  Prox LAD DES    Social History   Tobacco Use  Smoking Status Never Smoker  Smokeless Tobacco Former Neurosurgeon    Social History   Substance and Sexual Activity  Alcohol Use No    Family History  Problem Relation Age of Onset  . Heart attack Mother     Review of Systems: As noted in history of present illness.  All other systems were reviewed and are negative.  Physical Exam: BP 126/78   Pulse 60   Ht 5' (1.524 m)   Wt 190 lb (86.2 kg)   BMI 37.11 kg/m  GENERAL:  Well appearing BF in NAD  HEENT:  PERRL, EOMI, sclera are clear. Oropharynx is clear. NECK:  No jugular venous distention, carotid upstroke brisk and symmetric, no bruits, no thyromegaly or adenopathy LUNGS:  Clear to auscultation bilaterally CHEST:  Unremarkable HEART:  RRR,  PMI not displaced or sustained,S1 and S2 within normal limits, no S3, no S4: no clicks, no rubs, no murmurs ABD:  Soft, nontender. BS +, no masses or bruits. No hepatomegaly, no splenomegaly EXT:  2 + pulses throughout, no edema, no cyanosis no clubbing SKIN:  Warm and dry.  No rashes NEURO:  Alert and oriented x 3. Cranial nerves II through XII intact. PSYCH:  Cognitively intact      LABORATORY DATA: Lab Results  Component Value Date   WBC 8.9 07/28/2013   HGB 12.9 07/28/2013   HCT 38.9 07/28/2013   PLT 299.0 07/28/2013   GLUCOSE 105 (H) 03/30/2018   CHOL 149 03/30/2018   TRIG 96 03/30/2018   HDL 49 03/30/2018   LDLCALC 81 03/30/2018   ALT 20 03/30/2018   AST 20 03/30/2018   NA 140 03/30/2018   K 4.0 03/30/2018   CL 107 (H) 03/30/2018   CREATININE 0.66 03/30/2018   BUN 16 03/30/2018   CO2 20 03/30/2018   TSH 1.451 07/08/2013   INR 1.00 07/08/2013   HGBA1C 6.1 (H) 03/30/2018   Ecg today shows NSR rate 60.  Old septal infarct with low voltage. I have personally reviewed and interpreted this study.   Assessment / Plan: 1. Coronary disease status post anterior STEMI in 2014. Status post stenting of the proximal LAD with drug-eluting stent in 2014.    Myoview study in March 2017showed no ischemia with some scar. EF 55%. Cardiac cath in May 2017 in [facility] showed no obstructive disease. Repeat Myoview there in September 2017 showed mostly scar. She has stable class 1 angina. Continue medical therapy with prn Ntg. Focus on lifestyle modification with heart healthy diet, weight loss, and exercise. If angina becomes more frequent we will reevaluate.   2. Ischemic cardiomyopathy. EF improved to 55% by Myoview. Also normal by last Cath. Stressed importance of sodium restriction.  She appears euvolemic today.  3. Atrial fibrillation. No recurrence.   4.  Memory loss. Seen previously by Neuro.   6. PAD. Status post distal abdominal aortic stenting in the past. Last ABIs at [facility] 1/15 were normal. Will update LE and abdominal dopplers since she has not followed up at [facility].   7. Hypercholesterolemia.  on lipitor. On last visit we increased lipitor ot 80 mg daily.  Will check lab work today.  8. COPD- inhalers renewed.   I will follow up in 6 months

## 2018-10-28 ENCOUNTER — Ambulatory Visit (INDEPENDENT_AMBULATORY_CARE_PROVIDER_SITE_OTHER): Payer: BLUE CROSS/BLUE SHIELD | Admitting: Cardiology

## 2018-10-28 ENCOUNTER — Encounter: Payer: Self-pay | Admitting: Cardiology

## 2018-10-28 VITALS — BP 126/78 | HR 60 | Ht 60.0 in | Wt 190.0 lb

## 2018-10-28 DIAGNOSIS — I739 Peripheral vascular disease, unspecified: Secondary | ICD-10-CM

## 2018-10-28 DIAGNOSIS — I70219 Atherosclerosis of native arteries of extremities with intermittent claudication, unspecified extremity: Secondary | ICD-10-CM | POA: Diagnosis not present

## 2018-10-28 DIAGNOSIS — I480 Paroxysmal atrial fibrillation: Secondary | ICD-10-CM | POA: Diagnosis not present

## 2018-10-28 DIAGNOSIS — E785 Hyperlipidemia, unspecified: Secondary | ICD-10-CM

## 2018-10-28 DIAGNOSIS — I700 Atherosclerosis of aorta: Secondary | ICD-10-CM

## 2018-10-28 DIAGNOSIS — I25119 Atherosclerotic heart disease of native coronary artery with unspecified angina pectoris: Secondary | ICD-10-CM

## 2018-10-28 LAB — HEPATIC FUNCTION PANEL
ALBUMIN: 3.8 g/dL (ref 3.5–5.5)
ALT: 27 IU/L (ref 0–32)
AST: 24 IU/L (ref 0–40)
Alkaline Phosphatase: 94 IU/L (ref 39–117)
BILIRUBIN, DIRECT: 0.09 mg/dL (ref 0.00–0.40)
Bilirubin Total: 0.2 mg/dL (ref 0.0–1.2)
Total Protein: 6.9 g/dL (ref 6.0–8.5)

## 2018-10-28 LAB — LIPID PANEL
CHOL/HDL RATIO: 2.8 ratio (ref 0.0–4.4)
Cholesterol, Total: 135 mg/dL (ref 100–199)
HDL: 48 mg/dL (ref >39)
LDL Calculated: 76 mg/dL (ref 0–99)
Triglycerides: 55 mg/dL (ref 0–149)
VLDL Cholesterol Cal: 11 mg/dL (ref 5–40)

## 2018-10-28 LAB — BASIC METABOLIC PANEL
BUN / CREAT RATIO: 20 (ref 9–23)
BUN: 12 mg/dL (ref 6–24)
CHLORIDE: 106 mmol/L (ref 96–106)
CO2: 23 mmol/L (ref 20–29)
Calcium: 8.7 mg/dL (ref 8.7–10.2)
Creatinine, Ser: 0.6 mg/dL (ref 0.57–1.00)
GFR calc non Af Amer: 100 mL/min/{1.73_m2} (ref >59)
GFR, EST AFRICAN AMERICAN: 115 mL/min/{1.73_m2} (ref >59)
Glucose: 112 mg/dL — ABNORMAL HIGH (ref 65–99)
POTASSIUM: 4.5 mmol/L (ref 3.5–5.2)
Sodium: 141 mmol/L (ref 134–144)

## 2018-10-28 NOTE — Patient Instructions (Signed)
Medication Instructions:  Continue same medications If you need a refill on your cardiac medications before your next appointment, please call your pharmacy.   Lab work: Bmet,lipid and hepatic panels today If you have labs (blood work) drawn today and your tests are completely normal, you will receive your results only by: Marland Kitchen. MyChart Message (if you have MyChart) OR . A paper copy in the mail If you have any lab test that is abnormal or we need to change your treatment, we will call you to review the results.  Testing/Procedures: Schedule Lower Ext Arterial and abd aorta/illiac dopplers  Follow-Up: At [facility], you and your health needs are our priority.  As part of our continuing mission to provide you with exceptional heart care, we have created designated Provider Care Teams.  These Care Teams include your primary Cardiologist (physician) and Advanced Practice Providers (APPs -  Physician Assistants and Nurse Practitioners) who all work together to provide you with the care you need, when you need it. . Follow Up Appointment with Dr.Jordan in 6 months Call 2 months before to schedule

## 2018-11-01 ENCOUNTER — Other Ambulatory Visit: Payer: Self-pay | Admitting: Cardiology

## 2018-11-01 DIAGNOSIS — I70219 Atherosclerosis of native arteries of extremities with intermittent claudication, unspecified extremity: Secondary | ICD-10-CM

## 2018-11-04 ENCOUNTER — Ambulatory Visit (HOSPITAL_BASED_OUTPATIENT_CLINIC_OR_DEPARTMENT_OTHER)
Admission: RE | Admit: 2018-11-04 | Discharge: 2018-11-04 | Disposition: A | Discharge status: Home / Self Care | Payer: BLUE CROSS/BLUE SHIELD | Source: Ambulatory Visit | Attending: Cardiology | Admitting: Cardiology

## 2018-11-04 ENCOUNTER — Other Ambulatory Visit: Payer: Self-pay | Admitting: Cardiology

## 2018-11-04 ENCOUNTER — Ambulatory Visit (HOSPITAL_COMMUNITY)
Admission: RE | Admit: 2018-11-04 | Discharge: 2018-11-04 | Disposition: A | Discharge status: Home / Self Care | Payer: BLUE CROSS/BLUE SHIELD | Source: Ambulatory Visit | Attending: Cardiovascular Disease | Admitting: Cardiovascular Disease

## 2018-11-04 ENCOUNTER — Telehealth: Payer: Self-pay | Admitting: Cardiology

## 2018-11-04 DIAGNOSIS — I70219 Atherosclerosis of native arteries of extremities with intermittent claudication, unspecified extremity: Secondary | ICD-10-CM | POA: Diagnosis present

## 2018-11-04 DIAGNOSIS — I4891 Unspecified atrial fibrillation: Secondary | ICD-10-CM

## 2018-11-04 DIAGNOSIS — I480 Paroxysmal atrial fibrillation: Secondary | ICD-10-CM | POA: Insufficient documentation

## 2018-11-04 DIAGNOSIS — I5022 Chronic systolic (congestive) heart failure: Secondary | ICD-10-CM

## 2018-11-04 NOTE — Telephone Encounter (Signed)
New Message    *STAT* If patient is at the pharmacy, call can be transferred to refill team.   1. Which medications need to be refilled? (please list name of each medication and dose if known) Potassium Chloride (Tab CR) Furosemide (Tab) Losartan Potassium (Tab)  2. Which pharmacy/location (including street and city if local pharmacy) is medication to be sent to? [facility]  [place]  3. Do they need a 30 day or 90 day supply? 90 Day

## 2018-11-05 MED ORDER — POTASSIUM CHLORIDE ER 10 MEQ PO TBCR: 10.0000 meq | EXTENDED_RELEASE_TABLET | Freq: Every day | ORAL | 3 refills | Status: DC

## 2018-11-05 MED ORDER — FUROSEMIDE 20 MG PO TABS: 20.0000 mg | ORAL_TABLET | Freq: Every day | ORAL | 3 refills | Status: DC

## 2018-11-05 MED ORDER — LOSARTAN POTASSIUM 25 MG PO TABS: 25.0000 mg | ORAL_TABLET | Freq: Every day | ORAL | 3 refills | Status: DC

## 2018-11-05 NOTE — Telephone Encounter (Signed)
rx sent to pharmacy

## 2018-11-12 ENCOUNTER — Encounter (HOSPITAL_COMMUNITY): Payer: Self-pay

## 2018-11-26 ENCOUNTER — Other Ambulatory Visit: Payer: Self-pay | Admitting: Cardiology

## 2018-12-27 ENCOUNTER — Other Ambulatory Visit: Payer: Self-pay | Admitting: Cardiology

## 2019-03-22 ENCOUNTER — Other Ambulatory Visit (HOSPITAL_COMMUNITY): Payer: Self-pay | Admitting: Cardiology

## 2019-03-22 DIAGNOSIS — I739 Peripheral vascular disease, unspecified: Secondary | ICD-10-CM

## 2019-04-07 ENCOUNTER — Telehealth: Payer: Self-pay | Admitting: Cardiology

## 2019-04-08 NOTE — Progress Notes (Signed)
Virtual Visit via Telephone Note   This visit type was conducted due to national recommendations for restrictions regarding the COVID-19 Pandemic (e.g. social distancing) in an effort to limit this patient's exposure and mitigate transmission in our community.  Due to her co-morbid illnesses, this patient is at least at moderate risk for complications without adequate follow up.  This format is felt to be most appropriate for this patient at this time.  The patient did not have access to video technology/had technical difficulties with video requiring transitioning to audio format only (telephone).  All issues noted in this document were discussed and addressed.  No physical exam could be performed with this format.  Please refer to the patient's chart for her  consent to telehealth for [facility].   Date:  04/08/2019   ID:  Traci Choi, DOB [birth year], MRN [id number]  Patient Location: Home Provider Location: Home  PCP:  Patient, No Pcp Per  Cardiologist:  Traci Lablanc [place] MD Electrophysiologist:  None   Evaluation Performed:  Follow-Up Visit  Chief Complaint:  Follow up CAD  History of Present Illness:    Traci Choi is a 58 y.o. female with  a history of coronary disease, peripheral vascular disease, and COPD. She was admitted in August 2014 with an anterior STEMI. She had emergent stenting of the proximal LAD with DES. She had 70-80% disease in a small OM2 and PDA. Treated medically.  Ejection fraction was 35-40% at that time. Because of recurrent chest pain ECG changes she underwent repeat angiography during that hospitalization which showed continued stent patency. She did develop atrial fibrillation and was treated with amiodarone but this has since been discontinued. Follow up Echo in Dec. 2014 showed EF 40-45%. In March 2017 she had a Myoview study that showed fixed defects in the mid apical anterior and apical inferior walls. No ischemia. EF 55%.   In May 2017 she went to the  hospital in [place] with recurrent chest and throat pain. States she had constant pain for 3 days. Improved with Ntg. She ruled out for MI. LDL was 81. CXR was normal. Cardiac cath was performed showing a patent stent in the LAD. There was 40-50% stenosis in first diagonal, second OM and PDA. She was treated for GERD and had resolution of her chest pain.  She continues to note memory loss. We stopped her lipitor to see if this was a factor but she noted no change. Lipitor was resumed.   She was  admitted at [facility] on 07/17/2016 for chest pain. She had negative troponin values. Echo showing normal echo with EF of 55-65% and no wall motion abnormalities. No significant valve abnormalities. She underwent a NST which showed a large, severe partially reversible defect involving the mid-apical anterior, anteroseptal, apical inferior, apical septal, and apical walls, thought to be consistent with previous infarction and peri-infarct ischemia. Overall interrpreted as moderate-risk. Images were not available for review. She was seen back here in September 2017 and May 2018 and was doing well on medical therapy.   She is followed at [facility] for COPD. Last PFTs in May 2018 showed worsening function and she was placed on Anoro. She has remote stenting of the distal abdominal aorta in the 1990s at [facility].  We performed aortic and LE arterial dopplers in December 2019 showing no significant aortic, iliac, or LE stenosis.   She states she has been doing very well. About 3 weeks ago she had some off and on chest pain. Resolved  with sl Ntg. She thinks it was related to the fact she was doing lot of cleaning and overdid it. These symptoms resolved and she did not seek attention. She does have SOB if she walks too fast or when she bends over. She is not smoking. No claudication.   The patient does not have symptoms concerning for COVID-19 infection (fever, chills, cough, or new shortness of breath).    Past  Medical History:  Diagnosis Date   Chronic systolic CHF (congestive heart failure) (HCC)    a. echo 07/08/13: EF 35-40%, inferior, septal and apical HK;  b. 07/2016 Echo ([place], [place]): EF 55-65%, no signifiicant valvular abnormalities.   COPD (chronic obstructive pulmonary disease) (HCC)    followed at [facility]   Coronary artery disease    a. Anterior STEMI (8/14):  LHC - pLAD occluded (PCI: Promus premier 3x20 mm DES to), EF 35-40%;  b. Re-look cath (9/14):  PLAD stent ok, D2 50-70 (small)->med Rx c. cath 03/2016 patent pLAD stent, 50% D1, 50% OM2->Med Rx;  d. 07/2016 MV ([place]): large ant/apical defect w/ peri-inf ischemia->Med rx.   GERD (gastroesophageal reflux disease)    Hyperlipidemia    Ischemic cardiomyopathy    a. 07/08/13 Echo: EF 35-40%, inferior, septal and apical HK;  b. 07/2016 Echo ([place], [place]): EF 55-65%.   PAD (peripheral artery disease) (HCC)    a. 01/2014 ABI's ([facility]): normal.   PAF (paroxysmal atrial fibrillation) (HCC)    a. 06/2013 post MI => Amiodarone x 1 month-->not on long term OAC (CHA2DS2VASc = 3).   Past Surgical History:  Procedure Laterality Date   ABDOMINAL AORTA STENT     LEFT HEART CATHETERIZATION WITH CORONARY ANGIOGRAM Bilateral 07/08/2013   Procedure: LEFT HEART CATHETERIZATION WITH CORONARY ANGIOGRAM;  Surgeon: Athen Riel M [place], MD;  Location: [facility] CATH LAB;  Service: Cardiovascular;  Laterality: Bilateral;   LEFT HEART CATHETERIZATION WITH CORONARY ANGIOGRAM N/A 07/12/2013   Procedure: LEFT HEART CATHETERIZATION WITH CORONARY ANGIOGRAM;  Surgeon: Dolorez Jeffrey M [place], MD;  Location[facility] C[facility] LAB;  Service: Cardiovascular;  Laterality: N/A;   PERCUTANEOUS STENT INTERVENTION  07/08/2013   Procedure: PERCUTANEOUS STENT INTERVENTION;  Surgeon: Danyel Tobey M [place], MD;  Location: [facility] CATH LAB;  Service: Cardiovascular;;  Prox LAD DES     No outpatient medications have been marked as taking for the 04/11/19 encounter (Appointment) with Choi, Layman Gully M, MD.      Allergies:   Metronidazole; Sulfa antibiotics; Codeine; and Penicillins   Social History   Tobacco Use   Smoking status: Never Smoker   Smokeless tobacco: Former Engineer, water Use Topics   Alcohol use: No   Drug use: No     Family Hx: The patient's family history includes Heart attack in her mother.  ROS:   Please see the history of present illness.    All other systems reviewed and are negative.   Prior CV studies:   The following studies were reviewed today:  See above note  Labs/Other Tests and Data Reviewed:    EKG:  No ECG reviewed.  Recent Labs: 10/28/2018: ALT 27; BUN 12; Creatinine, Ser 0.60; Potassium 4.5; Sodium 141   Recent Lipid Panel Lab Results  Component Value Date/Time   CHOL 135 10/28/2018 09:16 AM   TRIG 55 10/28/2018 09:16 AM   HDL 48 10/28/2018 09:16 AM   CHOLHDL 2.8 10/28/2018 09:16 AM   CHOLHDL 2.6 03/17/2017 10:37 AM   LDLCALC 76 10/28/2018 09:16 AM    Wt Readings from Last 3  Encounters:  10/28/18 190 lb (86.2 kg)  03/30/18 188 lb (85.3 kg)  09/14/17 188 lb (85.3 kg)     Objective:    Vital Signs:  There were no vitals taken for this visit.   VITAL SIGNS:  reviewed  ASSESSMENT & PLAN:    1. Coronary disease status post anterior STEMI in 2014. Status post stenting of the proximal LAD with drug-eluting stent in 2014.    Myoview study in March 2017showed no ischemia with some scar. EF 55%. [facility] showed no obstructive disease. Repeat Myoview there in September 2017 showed mostly scar. She has stable class 1 angina except for some angina 3 weeks ago now resolved. Continue medical therapy with prn Ntg. Focus on lifestyle modification with heart healthy diet, weight loss, and exercise. I did express to her that if her symptoms should accelerate she should seek medical attention.   2. Ischemic cardiomyopathy. EF improved to 55% by Myoview. Also normal by last Cath. Weight is stable.   3.  Atrial fibrillation. No recurrence.   4.  Memory loss. Seen previously by Neuro.   6. PAD. Status post distal abdominal aortic stenting in the past. Last ABIs at [facility] 1/15 were normal. Repeat US here in December 2019 showed no stenosis.  7. Hypercholesterolemia.  on lipitor. On last visit we increased lipitor ot 80 mg daily.  last LDL 76.   8. COPD- inhalers renewed.  9. HTN patient reports BP usually is controlled. Will monitor. If BP remains high will increase losartan.  COVID-19 Education: The signs and symptoms of COVID-19 were discussed with the patient and how to seek care for testing (follow up with PCP or arrange E-visit).  The importance of social distancing was discussed today.  Time:   Today, I have spent 15 minutes with the patient with telehealth technology discussing the above problems.     Medication Adjustments/Labs and Tests Ordered: Current medicines are reviewed at length with the patient today.  Concerns regarding medicines are outlined above.   Tests Ordered: No orders of the defined types were placed in this encounter.   Medication Changes: No orders of the defined types were placed in this encounter.   Disposition:  Follow up in 6 month(s)  Signed, Kura Bethards [place], MD  04/08/2019 11:02 AM    [facility]

## 2019-04-11 ENCOUNTER — Telehealth (INDEPENDENT_AMBULATORY_CARE_PROVIDER_SITE_OTHER): Payer: BLUE CROSS/BLUE SHIELD | Admitting: Cardiology

## 2019-04-11 ENCOUNTER — Encounter: Payer: Self-pay | Admitting: Cardiology

## 2019-04-11 VITALS — BP 166/106 | HR 67 | Ht 60.0 in | Wt 190.0 lb

## 2019-04-11 DIAGNOSIS — I5022 Chronic systolic (congestive) heart failure: Secondary | ICD-10-CM

## 2019-04-11 DIAGNOSIS — I700 Atherosclerosis of aorta: Secondary | ICD-10-CM

## 2019-04-11 DIAGNOSIS — I10 Essential (primary) hypertension: Secondary | ICD-10-CM

## 2019-04-11 DIAGNOSIS — I25119 Atherosclerotic heart disease of native coronary artery with unspecified angina pectoris: Secondary | ICD-10-CM | POA: Diagnosis not present

## 2019-04-11 MED ORDER — ATORVASTATIN CALCIUM 80 MG PO TABS: 80.0000 mg | ORAL_TABLET | Freq: Every day | ORAL | 3 refills | Status: DC

## 2019-04-11 NOTE — Patient Instructions (Addendum)
Continue your current therapy.  Monitor BP. If it remains elevated > 135/85 then let me know and we will adjust medication  Follow up in 6 months with lab work  Call 3 months before to schedule appointment then have lab done 3 to 5 days before appointment   No appointment needed for lab work Lab opens 8:00 am to 12:00 noon nothing to eat or drink after midnight Lab orders enclosed

## 2019-04-11 NOTE — Addendum Note (Signed)
Addended by: Neoma Laming on: 04/11/2019 09:40 AM   Modules accepted: Orders

## 2019-05-12 NOTE — Telephone Encounter (Signed)
Open n error °

## 2019-05-31 ENCOUNTER — Telehealth: Payer: Self-pay | Admitting: Cardiology

## 2019-05-31 ENCOUNTER — Encounter: Payer: Self-pay | Admitting: Cardiology

## 2019-05-31 NOTE — Telephone Encounter (Signed)
Attempted to contact pt. Unable to leave message as mailbox is full.  °

## 2019-05-31 NOTE — Telephone Encounter (Signed)
Patient would like to talk to nurse about a problem that she has.

## 2019-05-31 NOTE — Telephone Encounter (Signed)
Error

## 2019-06-02 NOTE — Telephone Encounter (Signed)
Spoke to patient received [employer] form.Dr.Jordan completed.Form faxed to fax # [phone].Copy mailed to patient.

## 2019-06-06 NOTE — Telephone Encounter (Signed)
Patient is following up from a call received from [facility]

## 2019-06-06 NOTE — Telephone Encounter (Signed)
Spoke to patient [employer] form completed and faxed to # below on 7/23 and copy mailed to patient.

## 2019-07-02 ENCOUNTER — Other Ambulatory Visit: Payer: Self-pay | Admitting: Cardiology

## 2019-07-15 ENCOUNTER — Other Ambulatory Visit: Payer: Self-pay | Admitting: Cardiology

## 2019-10-22 ENCOUNTER — Other Ambulatory Visit: Payer: Self-pay | Admitting: Cardiology

## 2019-10-22 DIAGNOSIS — I5022 Chronic systolic (congestive) heart failure: Secondary | ICD-10-CM

## 2019-11-05 ENCOUNTER — Other Ambulatory Visit: Payer: Self-pay | Admitting: Cardiology

## 2019-11-05 DIAGNOSIS — I4891 Unspecified atrial fibrillation: Secondary | ICD-10-CM

## 2019-11-07 ENCOUNTER — Other Ambulatory Visit: Payer: Self-pay

## 2019-11-07 ENCOUNTER — Other Ambulatory Visit (HOSPITAL_COMMUNITY): Payer: Self-pay | Admitting: Cardiology

## 2019-11-07 ENCOUNTER — Ambulatory Visit (HOSPITAL_BASED_OUTPATIENT_CLINIC_OR_DEPARTMENT_OTHER)
Admission: RE | Admit: 2019-11-07 | Discharge: 2019-11-07 | Disposition: A | Discharge status: Home / Self Care | Payer: BC Managed Care – PPO | Source: Ambulatory Visit | Attending: Cardiology | Admitting: Cardiology

## 2019-11-07 ENCOUNTER — Ambulatory Visit (HOSPITAL_COMMUNITY)
Admission: RE | Admit: 2019-11-07 | Discharge: 2019-11-07 | Disposition: A | Discharge status: Home / Self Care | Payer: BC Managed Care – PPO | Source: Ambulatory Visit | Attending: Cardiovascular Disease | Admitting: Cardiovascular Disease

## 2019-11-07 DIAGNOSIS — I739 Peripheral vascular disease, unspecified: Secondary | ICD-10-CM

## 2019-11-22 NOTE — Progress Notes (Signed)
Traci Choi Date of Birth: 1961-10-24 Medical Record #[id number]  History of Present Illness: Traci Choi is seen today for followup CAD. She has a history of coronary disease, peripheral vascular disease, and COPD. She was admitted in August 2014 with an anterior STEMI. She had emergent stenting of the proximal LAD with DES. She had 70-80% disease in a small OM2 and PDA. Treated medically.  Ejection fraction was 35-40% at that time. Because of recurrent chest pain ECG changes she underwent repeat angiography during that hospitalization which showed continued stent patency. She did develop atrial fibrillation and was treated with amiodarone but this has since been discontinued. Follow up Echo in Dec. 2014 showed EF 40-45%. In March 2017 she had a Myoview study that showed fixed defects in the mid apical anterior and apical inferior walls. No ischemia. EF 55%.   In May 2017 she went to the hospital in [place] with recurrent chest and throat pain. States she had constant pain for 3 days. Improved with Ntg. She ruled out for MI. LDL was 81. CXR was normal. Cardiac cath was performed showing a patent stent in the LAD. There was 40-50% stenosis in first diagonal, second OM and PDA. She was treated for GERD and had resolution of her chest pain.  She continues to note memory loss. We stopped her lipitor to see if this was a factor but she noted no change. Lipitor was resumed.   She was  admitted at [facility] on 07/17/2016 for chest pain. She had negative troponin values. Echo showing normal echo with EF of 55-65% and no wall motion abnormalities. No significant valve abnormalities. She underwent a NST which showed a large, severe partially reversible defect involving the mid-apical anterior, anteroseptal, apical inferior, apical septal, and apical walls, thought to be consistent with previous infarction and peri-infarct ischemia. Overall interrpreted as moderate-risk. Images were not available for  review. She was seen back here in September 2017 and May 2018 and was doing well on medical therapy.   She is followed at [facility] for COPD. Last PFTs in May 2018 showed worsening function and she was placed on Anoro. She has remote stenting of the distal abdominal aorta in the 1990s at [facility].   On follow up today she states that for the past month she has had increased DOE with any activity. SOB tying her shoes. Mild cough. Minimal pedal edema. Weight is stable. Chest pain is unchanged. Needs refills on inhalers.   Current Outpatient Medications on File Prior to Visit  Medication Sig Dispense Refill  . aspirin 81 MG chewable tablet Chew 1 tablet (81 mg total) by mouth daily.    Marland Kitchen atorvastatin (LIPITOR) 80 MG tablet Take 1 tablet (80 mg total) by mouth daily. 90 tablet 3  . bisoprolol (ZEBETA) 5 MG tablet TAKE 1/2 TABLET BY MOUTH DAILY 15 tablet 6  . fluticasone (FLONASE) 50 MCG/ACT nasal spray Place 2 sprays into both nostrils daily as needed for allergies. 18.2 g 3  . furosemide (LASIX) 20 MG tablet TAKE 1 TABLET BY MOUTH ONCE DAILY. 90 tablet 3  . losartan (COZAAR) 25 MG tablet TAKE 1 TABLET BY MOUTH ONCE DAILY. 90 tablet 0  . nitroGLYCERIN (NITROSTAT) 0.4 MG SL tablet Place 0.4 mg under the tongue every 5 (five) minutes as needed for chest pain. 3 DOSES MAX    . pantoprazole (PROTONIX) 40 MG tablet TAKE 1 TABLET BY MOUTH ONCE DAILY 30 tablet 3  . potassium chloride (KLOR-CON) 10 MEQ tablet TAKE  1 TABLET BY MOUTH ONCE DAILY. 90 tablet 0  . traMADol (ULTRAM) 50 MG tablet Take 50 mg by mouth every 6 (six) hours as needed for moderate pain.      No current facility-administered medications on file prior to visit.    Allergies  Allergen Reactions  . Metronidazole Rash  . Sulfa Antibiotics Hives  . Codeine Other (See Comments)    Other Reaction: GI Upset   . Penicillins Nausea And Vomiting and Rash    Past Medical History:  Diagnosis Date  . Chronic systolic CHF (congestive heart failure)  ([place])    a. echo 07/08/13: EF 35-40%, inferior, septal and apical HK;  b. 07/2016 Echo ([place], [place]): EF 55-65%, no signifiicant valvular abnormalities.  Marland Kitchen COPD (chronic obstructive pulmonary disease) ([place])    followed at [facility]  . Coronary artery disease    a. Anterior STEMI (8/14):  LHC - pLAD occluded (PCI: Promus premier 3x20 mm DES to), EF 35-40%;  b. Re-look cath (9/14):  PLAD stent ok, D2 50-70 (small)->med Rx c. cath 03/2016 patent pLAD stent, 50% D1, 50% OM2->Med Rx;  d. 07/2016 MV ([place]): large ant/apical defect w/ peri-inf ischemia->Med rx.  Marland Kitchen GERD (gastroesophageal reflux disease)   . Hyperlipidemia   . Ischemic cardiomyopathy    a. 07/08/13 Echo: EF 35-40%, inferior, septal and apical HK;  b. 07/2016 Echo ([place], [place]): EF 55-65%.  Marland Kitchen PAD (peripheral artery disease) ([place])    a. 01/2014 ABI's ([facility]): normal.  . PAF (paroxysmal atrial fibrillation) ([place])    a. 06/2013 post MI => Amiodarone x 1 month-->not on long term OAC (CHA2DS2VASc = 3).    Past Surgical History:  Procedure Laterality Date  . ABDOMINAL AORTA STENT    . LEFT HEART CATHETERIZATION WITH CORONARY ANGIOGRAM Bilateral 07/08/2013   Procedure: LEFT HEART CATHETERIZATION WITH CORONARY ANGIOGRAM;  Surgeon: Skeeter Sheard M [place], MD;  Location: [facility] CATH LAB;  Service: Cardiovascular;  Laterality: Bilateral;  . LEFT HEART CATHETERIZATION WITH CORONARY ANGIOGRAM N/A 07/12/2013   Procedure: LEFT HEART CATHETERIZATION WITH CORONARY ANGIOGRAM;  Surgeon: Rashada Klontz M [place], MD;  Location: [facility] CATH LAB;  Service: Cardiovascular;  Laterality: N/A;  . PERCUTANEOUS STENT INTERVENTION  07/08/2013   Procedure: PERCUTANEOUS STENT INTERVENTION;  Surgeon: Sharmin Foulk M [place], MD;  Location: [facility] CATH LAB;  Service: Cardiovascular;;  Prox LAD DES    Social History   Tobacco Use  Smoking Status Never Smoker  Smokeless Tobacco Former Systems developer    Social History   Substance and Sexual Activity  Alcohol Use No    Family History  Problem Relation Age  of Onset  . Heart attack Mother     Review of Systems: As noted in history of present illness.  All other systems were reviewed and are negative.  Physical Exam: BP 126/76   Pulse 64   Ht 5' (1.524 m)   Wt 190 lb 6.4 oz (86.4 kg)   SpO2 99%   BMI 37.18 kg/m  GENERAL:  Well appearing BF in NAD HEENT:  PERRL, EOMI, sclera are clear. Oropharynx is clear. NECK:  No jugular venous distention, carotid upstroke brisk and symmetric, no bruits, no thyromegaly or adenopathy LUNGS:  Clear to auscultation bilaterally CHEST:  Unremarkable HEART:  RRR,  PMI not displaced or sustained,S1 and S2 within normal limits, no S3, no S4: no clicks, no rubs, no murmurs ABD:  Soft, nontender. BS +, no masses or bruits. No hepatomegaly, no splenomegaly EXT:  2 + pulses throughout, no edema, no cyanosis no  clubbing SKIN:  Warm and dry.  No rashes NEURO:  Alert and oriented x 3. Cranial nerves II through XII intact. PSYCH:  Cognitively intact      LABORATORY DATA: Lab Results  Component Value Date   WBC 8.9 07/28/2013   HGB 12.9 07/28/2013   HCT 38.9 07/28/2013   PLT 299.0 07/28/2013   GLUCOSE 112 (H) 10/28/2018   CHOL 135 10/28/2018   TRIG 55 10/28/2018   HDL 48 10/28/2018   LDLCALC 76 10/28/2018   ALT 27 10/28/2018   AST 24 10/28/2018   NA 141 10/28/2018   K 4.5 10/28/2018   CL 106 10/28/2018   CREATININE 0.60 10/28/2018   BUN 12 10/28/2018   CO2 23 10/28/2018   TSH 1.451 07/08/2013   INR 1.00 07/08/2013   HGBA1C 6.1 (H) 03/30/2018   Ecg today shows NSR.Old septal infarct with low voltage. I have personally reviewed and interpreted this study.   Assessment / Plan: 1. Coronary disease status post anterior STEMI in 2014. Status post stenting of the proximal LAD with drug-eluting stent in 2014.    Myoview study in March 2017showed no ischemia with some scar. EF 55%. Cardiac cath in May 2017 in [facility] showed no obstructive disease. Repeat Myoview there in September 2017  showed mostly scar. She has stable class 1 angina. Continue medical therapy with prn Ntg. Focus on lifestyle modification with heart healthy diet, weight loss, and exercise.   2. Ischemic cardiomyopathy. EF improved to 55% by Myoview. Also normal by last Cath. Stressed importance of sodium restriction.  She appears euvolemic today.  3. Atrial fibrillation. No recurrence.   4.  Memory loss. Seen previously by Neuro.   6. PAD. Status post distal abdominal aortic stenting in the past. Recent dopplers in Dec 2020 showed patent stents and normal ABIs.   7. Hypercholesterolemia.  on lipitor. Good control   8. COPD- increasing symptoms of dyspnea. inhalers renewed. Recommend she follow up with Pulmonary at [facility].    I will follow up in 6 months

## 2019-11-28 ENCOUNTER — Encounter: Payer: Self-pay | Admitting: Cardiology

## 2019-11-28 ENCOUNTER — Other Ambulatory Visit: Payer: Self-pay

## 2019-11-28 ENCOUNTER — Ambulatory Visit (INDEPENDENT_AMBULATORY_CARE_PROVIDER_SITE_OTHER): Payer: BC Managed Care – PPO | Admitting: Cardiology

## 2019-11-28 VITALS — BP 126/76 | HR 64 | Ht 60.0 in | Wt 190.4 lb

## 2019-11-28 DIAGNOSIS — I25119 Atherosclerotic heart disease of native coronary artery with unspecified angina pectoris: Secondary | ICD-10-CM | POA: Diagnosis not present

## 2019-11-28 DIAGNOSIS — I10 Essential (primary) hypertension: Secondary | ICD-10-CM

## 2019-11-28 DIAGNOSIS — R0600 Dyspnea, unspecified: Secondary | ICD-10-CM

## 2019-11-28 DIAGNOSIS — J449 Chronic obstructive pulmonary disease, unspecified: Secondary | ICD-10-CM

## 2019-11-28 DIAGNOSIS — R0609 Other forms of dyspnea: Secondary | ICD-10-CM

## 2019-11-28 DIAGNOSIS — I739 Peripheral vascular disease, unspecified: Secondary | ICD-10-CM

## 2019-11-28 MED ORDER — ANORO ELLIPTA 62.5-25 MCG/INH IN AEPB: 1.0000 | INHALATION_SPRAY | Freq: Every day | RESPIRATORY_TRACT | 6 refills | Status: DC

## 2019-11-28 MED ORDER — ALBUTEROL SULFATE HFA 108 (90 BASE) MCG/ACT IN AERS: 2.0000 | INHALATION_SPRAY | Freq: Four times a day (QID) | RESPIRATORY_TRACT | 3 refills | Status: DC | PRN

## 2019-11-28 MED ORDER — BENZONATATE 100 MG PO CAPS: 100.0000 mg | ORAL_CAPSULE | Freq: Two times a day (BID) | ORAL | 3 refills | Status: DC

## 2019-11-28 NOTE — Patient Instructions (Addendum)
  You need to arrange follow up with Pulmonary.   Vinnie Level NP [phone]  Medication Instructions: NO CHANGES *If you need a refill on your cardiac medications before your next appointment, please call your pharmacy*  Lab Work: NONE If you have labs (blood work) drawn today and your tests are completely normal, you will receive your results only by: Marland Kitchen MyChart Message (if you have MyChart) OR . A paper copy in the mail If you have any lab test that is abnormal or we need to change your treatment, we will call you to review the results.  Follow-Up: At [facility], you and your health needs are our priority.  As part of our continuing mission to provide you with exceptional heart care, we have created designated Provider Care Teams.  These Care Teams include your primary Cardiologist (physician) and Advanced Practice Providers (APPs -  Physician Assistants and Nurse Practitioners) who all work together to provide you with the care you need, when you need it.  Your next appointment:   6 month(s)  The format for your next appointment:   In Person  Provider:   DR. PETER Swaziland Other Instructions FOLLOW UP WITH PULMONARY.

## 2019-12-16 ENCOUNTER — Other Ambulatory Visit: Payer: Self-pay | Admitting: Cardiology

## 2020-01-17 ENCOUNTER — Other Ambulatory Visit: Payer: Self-pay | Admitting: Cardiology

## 2020-02-02 ENCOUNTER — Other Ambulatory Visit: Payer: Self-pay | Admitting: Cardiology

## 2020-02-02 DIAGNOSIS — I4891 Unspecified atrial fibrillation: Secondary | ICD-10-CM

## 2020-02-21 ENCOUNTER — Other Ambulatory Visit: Payer: Self-pay | Admitting: Cardiology

## 2020-05-07 ENCOUNTER — Other Ambulatory Visit: Payer: Self-pay | Admitting: Cardiology

## 2020-05-24 NOTE — Progress Notes (Signed)
Traci Choi Date of Birth: [birth year] Medical Record #[id number]  History of Present Illness: Traci Choi is seen today for followup CAD. She has a history of coronary disease, peripheral vascular disease, and COPD. She was admitted in August 2014 with an anterior STEMI. She had emergent stenting of the proximal LAD with DES. She had 70-80% disease in a small OM2 and PDA. Treated medically.  Ejection fraction was 35-40% at that time. Because of recurrent chest pain ECG changes she underwent repeat angiography during that hospitalization which showed continued stent patency. She did develop atrial fibrillation and was treated with amiodarone but this has since been discontinued. Follow up Echo in Dec. 2014 showed EF 40-45%. In March 2017 she had a Myoview study that showed fixed defects in the mid apical anterior and apical inferior walls. No ischemia. EF 55%.   In May 2017 she went to the hospital in [place] with recurrent chest and throat pain. States she had constant pain for 3 days. Improved with Ntg. She ruled out for MI. LDL was 81. CXR was normal. Cardiac cath was performed showing a patent stent in the LAD. There was 40-50% stenosis in first diagonal, second OM and PDA. She was treated for GERD and had resolution of her chest pain.  She continues to note memory loss. We stopped her lipitor to see if this was a factor but she noted no change. Lipitor was resumed.   She was  admitted at [facility] on 07/17/2016 for chest pain. She had negative troponin values. Echo showing normal echo with EF of 55-65% and no wall motion abnormalities. No significant valve abnormalities. She underwent a NST which showed a large, severe partially reversible defect involving the mid-apical anterior, anteroseptal, apical inferior, apical septal, and apical walls, thought to be consistent with previous infarction and peri-infarct ischemia. Overall interrpreted as moderate-risk. Images were not available for  review. She was seen back here in September 2017 and May 2018 and was doing well on medical therapy.   She is followed at [facility] for COPD. Last PFTs in May 2018 showed worsening function and she was placed on Anoro. She has remote stenting of the distal abdominal aorta in the 1990s at [facility].   On follow up today she states that she is doing well. She still has SOB. Unchanged from prior. Trying to get reestablished with pulmonary clinic at [facility].   Minimal pedal edema. Weight is stable. Chest pain is unchanged minor symptoms less than once a month. She is walking 30 minutes 2 days a week.   Current Outpatient Medications on File Prior to Visit  Medication Sig Dispense Refill  . albuterol (VENTOLIN HFA) 108 (90 Base) MCG/ACT inhaler Inhale 2 puffs into the lungs every 6 (six) hours as needed for wheezing. 1 g 3  . aspirin 81 MG chewable tablet Chew 1 tablet (81 mg total) by mouth daily.    Marland Kitchen atorvastatin (LIPITOR) 80 MG tablet TAKE 1 TABLET BY MOUTH ONCE DAILY. 90 tablet 1  . benzonatate (TESSALON) 100 MG capsule Take 1 capsule (100 mg total) by mouth 2 (two) times daily. 30 capsule 3  . bisoprolol (ZEBETA) 5 MG tablet TAKE 1/2 TABLET BY MOUTH DAILY 45 tablet 2  . fluticasone (FLONASE) 50 MCG/ACT nasal spray Place 2 sprays into both nostrils daily as needed for allergies. 18.2 g 3  . furosemide (LASIX) 20 MG tablet TAKE 1 TABLET BY MOUTH ONCE DAILY. 90 tablet 3  . losartan (COZAAR) 25 MG tablet TAKE  1 TABLET BY MOUTH ONCE DAILY. 90 tablet 2  . pantoprazole (PROTONIX) 40 MG tablet TAKE 1 TABLET BY MOUTH ONCE DAILY 30 tablet 8  . potassium chloride (KLOR-CON) 10 MEQ tablet TAKE 1 TABLET BY MOUTH ONCE DAILY. 90 tablet 2  . traMADol (ULTRAM) 50 MG tablet Take 50 mg by mouth every 6 (six) hours as needed for moderate pain.     Marland Kitchen umeclidinium-vilanterol (ANORO ELLIPTA) 62.5-25 MCG/INH AEPB Inhale 1 puff into the lungs daily. 14 each 6   No current facility-administered medications on file prior to visit.     Allergies  Allergen Reactions  . Metronidazole Rash  . Sulfa Antibiotics Hives  . Codeine Other (See Comments)    Other Reaction: GI Upset   . Penicillins Nausea And Vomiting and Rash    Past Medical History:  Diagnosis Date  . Chronic systolic CHF (congestive heart failure) (H[facility])    a. echo 07/08/13: EF 35-40%, inferior, septal and apical HK;  b. 07/2016 Echo ([place], [place]): EF 55-65%, no signifiicant valvular abnormalities.  Marland Kitchen COPD (chronic obstructive pulmonary disease) (H[facility])    followed at [facility]  . Coronary artery disease    a. Anterior STEMI (8/14):  LHC - pLAD occluded (PCI: Promus premier 3x20 mm DES to), EF 35-40%;  b. Re-look cath (9/14):  PLAD stent ok, D2 50-70 (small)->med Rx c. cath 03/2016 patent pLAD stent, 50% D1, 50% OM2->Med Rx;  d. 07/2016 MV ([place]): large ant/apical defect w/ peri-inf ischemia->Med rx.  Marland Kitchen GERD (gastroesophageal reflux disease)   . Hyperlipidemia   . Ischemic cardiomyopathy    a. 07/08/13 Echo: EF 35-40%, inferior, septal and apical HK;  b. 07/2016 Echo ([place], [place]): EF 55-65%.  Marland Kitchen PAD (peripheral artery disease) (HCC)    a. 01/2014 ABI's ([facility]): normal.  . PAF (paroxysmal atrial fibrillation) ([facility])    a. 06/2013 post MI => Amiodarone x 1 month-->not on long term OAC (CHA2DS2VASc = 3).    Past Surgical History:  Procedure Laterality Date  . ABDOMINAL AORTA STENT    . LEFT HEART CATHETERIZATION WITH CORONARY ANGIOGRAM Bilateral 07/08/2013   Procedure: LEFT HEART CATHETERIZATION WITH CORONARY ANGIOGRAM;  Surgeon: Mattie Nordell M [place], MD;  Location: [facility] CATH LAB;  Service: Cardiovascular;  Laterality: Bilateral;  . LEFT HEART CATHETERIZATION WITH CORONARY ANGIOGRAM N/A 07/12/2013   Procedure: LEFT HEART CATHETERIZATION WITH CORONARY ANGIOGRAM;  Surgeon: Claudis Giovanelli M [place], MD;  Location: [facility] CATH LAB;  Service: Cardiovascular;  Laterality: N/A;  . PERCUTANEOUS STENT INTERVENTION  07/08/2013   Procedure: PERCUTANEOUS STENT INTERVENTION;  Surgeon: Tarica Harl M  [place], MD;  Location: [facility] CATH LAB;  Service: Cardiovascular;;  Prox LAD DES    Social History   Tobacco Use  Smoking Status Never Smoker  Smokeless Tobacco Former Neurosurgeon    Social History   Substance and Sexual Activity  Alcohol Use No    Family History  Problem Relation Age of Onset  . Heart attack Mother     Review of Systems: As noted in history of present illness.  All other systems were reviewed and are negative.  Physical Exam: BP 128/76   Pulse 72   Ht 5\' 1"  (1.549 m)   Wt 190 lb 6.4 oz (86.4 kg)   BMI 35.98 kg/m  GENERAL:  Well appearing BF in NAD HEENT:  PERRL, EOMI, sclera are clear. Oropharynx is clear. NECK:  No jugular venous distention, carotid upstroke brisk and symmetric, no bruits, no thyromegaly or adenopathy LUNGS:  Clear to auscultation bilaterally CHEST:  Unremarkable HEART:  RRR,  PMI not displaced or sustained,S1 and S2 within normal limits, no S3, no S4: no clicks, no rubs, no murmurs ABD:  Soft, nontender. BS +, no masses or bruits. No hepatomegaly, no splenomegaly EXT:  2 + pulses throughout, no edema, no cyanosis no clubbing SKIN:  Warm and dry.  No rashes NEURO:  Alert and oriented x 3. Cranial nerves II through XII intact. PSYCH:  Cognitively intact   LABORATORY DATA: Lab Results  Component Value Date   WBC 8.9 07/28/2013   HGB 12.9 07/28/2013   HCT 38.9 07/28/2013   PLT 299.0 07/28/2013   GLUCOSE 112 (H) 10/28/2018   CHOL 135 10/28/2018   TRIG 55 10/28/2018   HDL 48 10/28/2018   LDLCALC 76 10/28/2018   ALT 27 10/28/2018   AST 24 10/28/2018   NA 141 10/28/2018   K 4.5 10/28/2018   CL 106 10/28/2018   CREATININE 0.60 10/28/2018   BUN 12 10/28/2018   CO2 23 10/28/2018   TSH 1.451 07/08/2013   INR 1.00 07/08/2013   HGBA1C 6.1 (H) 03/30/2018      Assessment / Plan: 1. Coronary disease status post anterior STEMI in 2014. Status post stenting of the proximal LAD with drug-eluting stent in 2014.    Myoview study in March  2017showed no ischemia with some scar. EF 55%. Cardiac cath in May 2017 in [facility] showed no obstructive disease. Repeat Myoview there in September 2017 showed mostly scar. She has stable class 1 angina. Continue medical therapy with prn Ntg. Focus on lifestyle modification with heart healthy diet, weight loss, and exercise.   2. Ischemic cardiomyopathy. EF improved to 55% by Myoview. Also normal by last Cath. Stressed importance of sodium restriction.  She appears euvolemic today.  3. Atrial fibrillation. No recurrence.   4.  Memory loss. Seen previously by Neuro.   6. PAD. Status post distal abdominal aortic stenting in the past. Recent dopplers in Dec 2020 showed patent stents and normal ABIs.   7. Hypercholesterolemia.  on lipitor. Will update lab work today  8. COPD- Recommend she follow up with Pulmonary at [facility].    I will follow up in 6 months

## 2020-05-28 ENCOUNTER — Other Ambulatory Visit: Payer: Self-pay

## 2020-05-28 ENCOUNTER — Ambulatory Visit (INDEPENDENT_AMBULATORY_CARE_PROVIDER_SITE_OTHER): Payer: BC Managed Care – PPO | Admitting: Cardiology

## 2020-05-28 ENCOUNTER — Encounter: Payer: Self-pay | Admitting: Cardiology

## 2020-05-28 VITALS — BP 128/76 | HR 72 | Ht 61.0 in | Wt 190.4 lb

## 2020-05-28 DIAGNOSIS — R0609 Other forms of dyspnea: Secondary | ICD-10-CM

## 2020-05-28 DIAGNOSIS — I25119 Atherosclerotic heart disease of native coronary artery with unspecified angina pectoris: Secondary | ICD-10-CM | POA: Diagnosis not present

## 2020-05-28 DIAGNOSIS — I739 Peripheral vascular disease, unspecified: Secondary | ICD-10-CM | POA: Diagnosis not present

## 2020-05-28 DIAGNOSIS — J449 Chronic obstructive pulmonary disease, unspecified: Secondary | ICD-10-CM | POA: Diagnosis not present

## 2020-05-28 DIAGNOSIS — R0600 Dyspnea, unspecified: Secondary | ICD-10-CM

## 2020-05-28 MED ORDER — NITROGLYCERIN 0.4 MG SL SUBL: 0.4000 mg | SUBLINGUAL_TABLET | SUBLINGUAL | 6 refills | Status: DC | PRN

## 2020-05-29 LAB — CBC WITH DIFFERENTIAL/PLATELET
Basophils Absolute: 0.1 10*3/uL (ref 0.0–0.2)
Basos: 1 %
EOS (ABSOLUTE): 0.1 10*3/uL (ref 0.0–0.4)
Eos: 1 %
Hematocrit: 43.5 % (ref 34.0–46.6)
Hemoglobin: 14.2 g/dL (ref 11.1–15.9)
Immature Grans (Abs): 0 10*3/uL (ref 0.0–0.1)
Immature Granulocytes: 0 %
Lymphocytes Absolute: 1.8 10*3/uL (ref 0.7–3.1)
Lymphs: 22 %
MCH: 29.8 pg (ref 26.6–33.0)
MCHC: 32.6 g/dL (ref 31.5–35.7)
MCV: 91 fL (ref 79–97)
Monocytes Absolute: 0.7 10*3/uL (ref 0.1–0.9)
Monocytes: 8 %
Neutrophils Absolute: 5.7 10*3/uL (ref 1.4–7.0)
Neutrophils: 68 %
Platelets: 190 10*3/uL (ref 150–450)
RBC: 4.76 x10E6/uL (ref 3.77–5.28)
RDW: 12.7 % (ref 11.7–15.4)
WBC: 8.4 10*3/uL (ref 3.4–10.8)

## 2020-05-29 LAB — COMPREHENSIVE METABOLIC PANEL
ALT: 18 IU/L (ref 0–32)
AST: 25 IU/L (ref 0–40)
Albumin/Globulin Ratio: 1.1 — ABNORMAL LOW (ref 1.2–2.2)
Albumin: 4 g/dL (ref 3.8–4.8)
Alkaline Phosphatase: 97 IU/L (ref 48–121)
BUN/Creatinine Ratio: 23 (ref 12–28)
BUN: 14 mg/dL (ref 8–27)
Bilirubin Total: 0.5 mg/dL (ref 0.0–1.2)
CO2: 22 mmol/L (ref 20–29)
Calcium: 8.5 mg/dL — ABNORMAL LOW (ref 8.7–10.3)
Chloride: 104 mmol/L (ref 96–106)
Creatinine, Ser: 0.62 mg/dL (ref 0.57–1.00)
GFR calc Af Amer: 113 mL/min/{1.73_m2} (ref >59)
GFR calc non Af Amer: 98 mL/min/{1.73_m2} (ref >59)
Globulin, Total: 3.6 g/dL (ref 1.5–4.5)
Glucose: 94 mg/dL (ref 65–99)
Potassium: 4.9 mmol/L (ref 3.5–5.2)
Sodium: 138 mmol/L (ref 134–144)
Total Protein: 7.6 g/dL (ref 6.0–8.5)

## 2020-05-29 LAB — LIPID PANEL
Chol/HDL Ratio: 3 ratio (ref 0.0–4.4)
Cholesterol, Total: 155 mg/dL (ref 100–199)
HDL: 52 mg/dL (ref >39)
LDL Chol Calc (NIH): 91 mg/dL (ref 0–99)
Triglycerides: 62 mg/dL (ref 0–149)
VLDL Cholesterol Cal: 12 mg/dL (ref 5–40)

## 2020-05-29 LAB — HEMOGLOBIN A1C
Est. average glucose Bld gHb Est-mCnc: 134 mg/dL
Hgb A1c MFr Bld: 6.3 % — ABNORMAL HIGH (ref 4.8–5.6)

## 2020-05-31 ENCOUNTER — Telehealth: Payer: Self-pay | Admitting: Cardiology

## 2020-05-31 NOTE — Telephone Encounter (Signed)
Spoke to pt who voiced she need a new Rx for cough medicine but her PCP wont be back into the office until 7/26. Pt report Dr. Swaziland has prescribed medication for her previously and wanted to know if he could send in a refill.   Will route to MD and nurse.

## 2020-05-31 NOTE — Telephone Encounter (Signed)
Attempted to contact pt. Unable to leave message as mailbox is full.  °

## 2020-05-31 NOTE — Telephone Encounter (Signed)
Patient is calling to see if Dr. [place]and will call her in some cough syrup. Traci Choi states that Dr. Drusilla Kanner in [place] prescribed Promethazine DM, she said she had called his office for a refill but he won't be in the office until 7/26. She said that her COPD Dr, Dr. Roxan Hockey from [facility] had called in Hydrocodone-Chlorpheniramine, but won't fill the prescription since she has not seen Dr. Roxan Hockey in around 2 years. If Dr. Swaziland does decide to send in a prescription, she said it can be sent to [facility] - [place], [place] - [address].

## 2020-05-31 NOTE — Telephone Encounter (Signed)
She can use OTC cough medicine like Robitussin DM or Tussin. I will not fill narcotic based cough medicine  Decklyn Hyder [place] MD, [facility]

## 2020-06-01 NOTE — Telephone Encounter (Signed)
Called patient no answer.Unable to leave a message mailbox is full.

## 2020-06-05 NOTE — Telephone Encounter (Signed)
Pt updated and verbalized understanding.  

## 2020-10-11 ENCOUNTER — Other Ambulatory Visit: Payer: Self-pay | Admitting: Cardiology

## 2020-10-11 DIAGNOSIS — I5022 Chronic systolic (congestive) heart failure: Secondary | ICD-10-CM

## 2020-10-26 ENCOUNTER — Other Ambulatory Visit: Payer: Self-pay | Admitting: Cardiology

## 2020-10-26 DIAGNOSIS — I4891 Unspecified atrial fibrillation: Secondary | ICD-10-CM

## 2020-10-28 ENCOUNTER — Other Ambulatory Visit: Payer: Self-pay | Admitting: Cardiology

## 2020-11-01 ENCOUNTER — Other Ambulatory Visit: Payer: Self-pay | Admitting: Cardiology

## 2020-11-06 ENCOUNTER — Ambulatory Visit (HOSPITAL_COMMUNITY): Payer: BC Managed Care – PPO

## 2020-11-26 ENCOUNTER — Ambulatory Visit (HOSPITAL_COMMUNITY): Payer: BC Managed Care – PPO

## 2020-11-26 ENCOUNTER — Ambulatory Visit (HOSPITAL_COMMUNITY)
Admission: RE | Admit: 2020-11-26 | Payer: BC Managed Care – PPO | Source: Ambulatory Visit | Attending: Cardiology | Admitting: Cardiology

## 2020-11-27 NOTE — Progress Notes (Signed)
Virtual Visit via Telephone Note   This visit type was conducted due to national recommendations for restrictions regarding the COVID-19 Pandemic (e.g. social distancing) in an effort to limit this patient's exposure and mitigate transmission in our community.  Due to her co-morbid illnesses, this patient is at least at moderate risk for complications without adequate follow up.  This format is felt to be most appropriate for this patient at this time.  The patient did not have access to video technology/had technical difficulties with video requiring transitioning to audio format only (telephone).  All issues noted in this document were discussed and addressed.  No physical exam could be performed with this format.  Please refer to the patient's chart for her  consent to telehealth for [facility].    Date:  11/27/2020   ID:  [name], DOB 1961-01-28, MRN [id number] The patient was identified using 2 identifiers.  Patient Location: Home Provider Location: Office/Clinic  PCP:  Patient, No Pcp Per  Cardiologist:  Shondra Capps [place] MD Electrophysiologist:  None   Evaluation Performed:  Follow-Up Visit  Chief Complaint:  CAD  History of Present Illness:    Traci Choi is a 60 y.o. female seen for follow up CAD. She has a history of coronary disease, peripheral vascular disease, and COPD. She was admitted in August 2014 with an anterior STEMI. She had emergent stenting of the proximal LAD with DES. She had 70-80% disease in a small OM2 and PDA. Treated medically.  Ejection fraction was 35-40% at that time. Because of recurrent chest pain ECG changes she underwent repeat angiography during that hospitalization which showed continued stent patency. She did develop atrial fibrillation and was treated with amiodarone but this has since been discontinued. Follow up Echo in Dec. 2014 showed EF 40-45%. In March 2017 she had a Myoview study that showed fixed defects in the mid apical anterior and  apical inferior walls. No ischemia. EF 55%.   In May 2017 she went to the hospital in [place] with recurrent chest and throat pain. States she had constant pain for 3 days. Improved with Ntg. She ruled out for MI. LDL was 81. CXR was normal. Cardiac cath was performed showing a patent stent in the LAD. There was 40-50% stenosis in first diagonal, second OM and PDA. She was treated for GERD and had resolution of her chest pain.  She continues to note memory loss. We stopped her lipitor to see if this was a factor but she noted no change. Lipitor was resumed.   She was  admitted at [facility] on 07/17/2016 for chest pain. She had negative troponin values. Echo showing normal echo with EF of 55-65% and no wall motion abnormalities. No significant valve abnormalities. She underwent a NST which showed a large, severe partially reversible defect involving the mid-apical anterior, anteroseptal, apical inferior, apical septal, and apical walls, thought to be consistent with previous infarction and peri-infarct ischemia. Overall interrpreted as moderate-risk. Images were not available for review. She was seen back here in September 2017 and May 2018 and was doing well on medical therapy.   She was followed at [facility] for COPD. Last seen and PFTs in May 2018 showed worsening function and she was placed on Anoro. She has remote stenting of the distal abdominal aorta in the 1990s at [facility]. Dopplers in Dec 2020 were good.   She has rare chest pain. Has only used Ntg once. Still has SOB but this has not changed. Has some pain  in her legs when she goes up stairs. Reports BP has been well controlled.    The patient does not have symptoms concerning for COVID-19 infection (fever, chills, cough, or new shortness of breath).    Past Medical History:  Diagnosis Date  . Chronic systolic CHF (congestive heart failure) (HCC)    a. echo 07/08/13: EF 35-40%, inferior, septal and apical HK;  b. 07/2016 Echo  ([place], [place]): EF 55-65%, no signifiicant valvular abnormalities.  Marland Kitchen COPD (chronic obstructive pulmonary disease) (HCC)    followed at [facility]  . Coronary artery disease    a. Anterior STEMI (8/14):  LHC - pLAD occluded (PCI: Promus premier 3x20 mm DES to), EF 35-40%;  b. Re-look cath (9/14):  PLAD stent ok, D2 50-70 (small)->med Rx c. cath 03/2016 patent pLAD stent, 50% D1, 50% OM2->Med Rx;  d. 07/2016 MV ([place], [place]): large ant/apical defect w/ peri-inf ischemia->Med rx.  Marland Kitchen GERD (gastroesophageal reflux disease)   . Hyperlipidemia   . Ischemic cardiomyopathy    a. 07/08/13 Echo: EF 35-40%, inferior, septal and apical HK;  b. 07/2016 Echo ([place], [place]): EF 55-65%.  Marland Kitchen PAD (peripheral artery disease) (HCC)    a. 01/2014 ABI's ([facility]): normal.  . PAF (paroxysmal atrial fibrillation) (HCC)    a. 06/2013 post MI => Amiodarone x 1 month-->not on long term OAC (CHA2DS2VASc = 3).   Past Surgical History:  Procedure Laterality Date  . ABDOMINAL AORTA STENT    . LEFT HEART CATHETERIZATION WITH CORONARY ANGIOGRAM Bilateral 07/08/2013   Procedure: LEFT HEART CATHETERIZATION WITH CORONARY ANGIOGRAM;  Surgeon: Bena Kobel M [place], MD;  Location: [facility] CATH LAB;  Service: Cardiovascular;  Laterality: Bilateral;  . LEFT HEART CATHETERIZATION WITH CORONARY ANGIOGRAM N/A 07/12/2013   Procedure: LEFT HEART CATHETERIZATION WITH CORONARY ANGIOGRAM;  Surgeon: Rayland Hamed M [place], MD;  Location: [facility] CATH LAB;  Service: Cardiovascular;  Laterality: N/A;  . PERCUTANEOUS STENT INTERVENTION  07/08/2013   Procedure: PERCUTANEOUS STENT INTERVENTION;  Surgeon: Nanette Wirsing M [place], MD;  Location: [facility] CATH LAB;  Service: Cardiovascular;;  Prox LAD DES     No outpatient medications have been marked as taking for the 11/29/20 encounter (Appointment) with [place], Glenda Kunst M, MD.     Allergies:   Metronidazole, Sulfa antibiotics, Codeine, and Penicillins   Social History   Tobacco Use  . Smoking status: Never Smoker  . Smokeless tobacco: Former  Clinical biochemist  . Vaping Use: Never used  Substance Use Topics  . Alcohol use: No  . Drug use: No     Family Hx: The patient's family history includes Heart attack in her mother.  ROS:   Please see the history of present illness.    All other systems reviewed and are negative.   Prior CV studies:   The following studies were reviewed today:  none  Labs/Other Tests and Data Reviewed:    EKG:  No ECG reviewed.  Recent Labs: 05/28/2020: ALT 18; BUN 14; Creatinine, Ser 0.62; Hemoglobin 14.2; Platelets 190; Potassium 4.9; Sodium 138   Recent Lipid Panel Lab Results  Component Value Date/Time   CHOL 155 05/28/2020 10:00 AM   TRIG 62 05/28/2020 10:00 AM   HDL 52 05/28/2020 10:00 AM   CHOLHDL 3.0 05/28/2020 10:00 AM   CHOLHDL 2.6 03/17/2017 10:37 AM   LDLCALC 91 05/28/2020 10:00 AM    Wt Readings from Last 3 Encounters:  05/28/20 190 lb 6.4 oz (86.4 kg)  11/28/19 190 lb 6.4 oz (86.4 kg)  04/11/19 190 lb (86.2 kg)  Risk Assessment/Calculations:      Objective:    Vital Signs:  There were no vitals taken for this visit.   VITAL SIGNS:  reviewed  ASSESSMENT & PLAN:    1. Coronary disease status post anterior STEMI in 2014. Status post stenting of the proximal LAD with drug-eluting stent in 2014.    Myoview study in March 2017showed no ischemia with some scar. EF 55%. Cardiac cath in May 2017 in [facility] showed no obstructive disease. Repeat Myoview there in September 2017 showed mostly scar. She has stable class 1 angina. Continue medical therapy with prn Ntg. Focus on lifestyle modification with heart healthy diet, weight loss, and exercise.   2. Ischemic cardiomyopathy. EF improved to 55% by Myoview. Also normal by last Cath. Stressed importance of sodium restriction.  She appears euvolemic today.  3. Atrial fibrillation. No recurrence.   4.  PAD. Status post distal abdominal aortic stenting in the past. Recent dopplers in Dec 2020 showed patent  stents and normal ABIs.   5. Hypercholesterolemia.  on lipitor. Last LDL 91. Will plan on repeat labs with next visit.  8. COPD- stable        COVID-19 Education: The signs and symptoms of COVID-19 were discussed with the patient and how to seek care for testing (follow up with PCP or arrange E-visit).  The importance of social distancing was discussed today.  Time:   Today, I have spent 12 minutes with the patient with telehealth technology discussing the above problems.     Medication Adjustments/Labs and Tests Ordered: Current medicines are reviewed at length with the patient today.  Concerns regarding medicines are outlined above.   Tests Ordered: No orders of the defined types were placed in this encounter.   Medication Changes: No orders of the defined types were placed in this encounter.   Follow Up:  In Person in 6 month(s)with lab  Signed, Paeton Latouche [place], MD  11/27/2020 1:15 PM    [facility]

## 2020-11-29 ENCOUNTER — Telehealth: Payer: Self-pay

## 2020-11-29 ENCOUNTER — Encounter: Payer: Self-pay | Admitting: Cardiology

## 2020-11-29 ENCOUNTER — Telehealth (INDEPENDENT_AMBULATORY_CARE_PROVIDER_SITE_OTHER): Payer: BC Managed Care – PPO | Admitting: Cardiology

## 2020-11-29 DIAGNOSIS — J449 Chronic obstructive pulmonary disease, unspecified: Secondary | ICD-10-CM | POA: Diagnosis not present

## 2020-11-29 DIAGNOSIS — R0600 Dyspnea, unspecified: Secondary | ICD-10-CM | POA: Diagnosis not present

## 2020-11-29 DIAGNOSIS — I739 Peripheral vascular disease, unspecified: Secondary | ICD-10-CM | POA: Diagnosis not present

## 2020-11-29 DIAGNOSIS — I25119 Atherosclerotic heart disease of native coronary artery with unspecified angina pectoris: Secondary | ICD-10-CM

## 2020-11-29 DIAGNOSIS — I10 Essential (primary) hypertension: Secondary | ICD-10-CM

## 2020-11-29 DIAGNOSIS — R0609 Other forms of dyspnea: Secondary | ICD-10-CM

## 2020-11-29 MED ORDER — BISOPROLOL FUMARATE 5 MG PO TABS: 2.5000 mg | ORAL_TABLET | Freq: Every day | ORAL | 3 refills | Status: DC

## 2020-11-29 NOTE — Telephone Encounter (Signed)
  Patient Consent for Virtual Visit         Traci Choi has provided verbal consent on 11/29/2020 for a virtual visit (video or telephone).   CONSENT FOR VIRTUAL VISIT FOR:  Traci Choi  By participating in this virtual visit I agree to the following:  I hereby voluntarily request, consent and authorize [facility] and its employed or contracted physicians, Producer, television/film/video, nurse practitioners or other licensed health care professionals (the Practitioner), to provide me with telemedicine health care services (the "Services") as deemed necessary by the treating Practitioner. I acknowledge and consent to receive the Services by the Practitioner via telemedicine. I understand that the telemedicine visit will involve communicating with the Practitioner through live audiovisual communication technology and the disclosure of certain medical information by electronic transmission. I acknowledge that I have been given the opportunity to request an in-person assessment or other available alternative prior to the telemedicine visit and am voluntarily participating in the telemedicine visit.  I understand that I have the right to withhold or withdraw my consent to the use of telemedicine in the course of my care at any time, without affecting my right to future care or treatment, and that the Practitioner or I may terminate the telemedicine visit at any time. I understand that I have the right to inspect all information obtained and/or recorded in the course of the telemedicine visit and may receive copies of available information for a reasonable fee.  I understand that some of the potential risks of receiving the Services via telemedicine include:  Marland Kitchen Delay or interruption in medical evaluation due to technological equipment failure or disruption; . Information transmitted may not be sufficient (e.g. poor resolution of images) to allow for appropriate medical decision making by the Practitioner;  and/or  . In rare instances, security protocols could fail, causing a breach of personal health information.  Furthermore, I acknowledge that it is my responsibility to provide information about my medical history, conditions and care that is complete and accurate to the best of my ability. I acknowledge that Practitioner's advice, recommendations, and/or decision may be based on factors not within their control, such as incomplete or inaccurate data provided by me or distortions of diagnostic images or specimens that may result from electronic transmissions. I understand that the practice of medicine is not an exact science and that Practitioner makes no warranties or guarantees regarding treatment outcomes. I acknowledge that a copy of this consent can be made available to me via my patient portal [facility] MyChart), or I can request a printed copy by calling the office of [facility].    I understand that my insurance will be billed for this visit.   I have read or had this consent read to me. . I understand the contents of this consent, which adequately explains the benefits and risks of the Services being provided via telemedicine.  . I have been provided ample opportunity to ask questions regarding this consent and the Services and have had my questions answered to my satisfaction. . I give my informed consent for the services to be provided through the use of telemedicine in my medical care

## 2020-11-29 NOTE — Patient Instructions (Signed)
Medication Instructions:  Continue same medications *If you need a refill on your cardiac medications before your next appointment, please call your pharmacy*   Lab Work: Have fasting lab bmet,lipid and hepatic panels done 1 week before July appointment Lab order enclosed   Testing/Procedures: None ordered   Follow-Up: At [facility], you and your health needs are our priority.  As part of our continuing mission to provide you with exceptional heart care, we have created designated Provider Care Teams.  These Care Teams include your primary Cardiologist (physician) and Advanced Practice Providers (APPs -  Physician Assistants and Nurse Practitioners) who all work together to provide you with the care you need, when you need it.  We recommend signing up for the patient portal called "MyChart".  Sign up information is provided on this After Visit Summary.  MyChart is used to connect with patients for Virtual Visits (Telemedicine).  Patients are able to view lab/test results, encounter notes, upcoming appointments, etc.  Non-urgent messages can be sent to your provider as well.   To learn more about what you can do with MyChart, go to [email or web address].    Your next appointment:  6 months    Call in March to schedule July appointment   The format for your next appointment: Office    Provider:  Dr.Jordan

## 2020-11-29 NOTE — Addendum Note (Signed)
Addended by: Neoma Laming on: 11/29/2020 10:54 AM   Modules accepted: Orders

## 2020-12-10 ENCOUNTER — Other Ambulatory Visit: Payer: Self-pay

## 2020-12-10 ENCOUNTER — Ambulatory Visit (HOSPITAL_BASED_OUTPATIENT_CLINIC_OR_DEPARTMENT_OTHER)
Admission: RE | Admit: 2020-12-10 | Discharge: 2020-12-10 | Disposition: A | Discharge status: Home / Self Care | Payer: BC Managed Care – PPO | Source: Ambulatory Visit | Attending: Cardiology | Admitting: Cardiology

## 2020-12-10 ENCOUNTER — Other Ambulatory Visit (HOSPITAL_COMMUNITY): Payer: Self-pay | Admitting: Cardiology

## 2020-12-10 ENCOUNTER — Ambulatory Visit (HOSPITAL_COMMUNITY)
Admission: RE | Admit: 2020-12-10 | Discharge: 2020-12-10 | Disposition: A | Discharge status: Home / Self Care | Payer: BC Managed Care – PPO | Source: Ambulatory Visit | Attending: Internal Medicine | Admitting: Internal Medicine

## 2020-12-10 DIAGNOSIS — I739 Peripheral vascular disease, unspecified: Secondary | ICD-10-CM

## 2020-12-10 DIAGNOSIS — Z95828 Presence of other vascular implants and grafts: Secondary | ICD-10-CM

## 2020-12-10 NOTE — Progress Notes (Signed)
vas 

## 2020-12-26 ENCOUNTER — Other Ambulatory Visit: Payer: Self-pay | Admitting: Cardiology

## 2021-04-17 ENCOUNTER — Other Ambulatory Visit: Payer: Self-pay | Admitting: Cardiology

## 2021-05-04 ENCOUNTER — Other Ambulatory Visit: Payer: Self-pay | Admitting: Cardiology

## 2021-06-11 ENCOUNTER — Telehealth: Payer: Self-pay | Admitting: Cardiology

## 2021-06-11 NOTE — Telephone Encounter (Signed)
Returned call to patient no answer.Left message on personal voice mail to call back. 

## 2021-06-11 NOTE — Telephone Encounter (Signed)
Pt is calling to speak with Elnita Maxwell

## 2021-06-13 NOTE — Telephone Encounter (Signed)
Patient is returning call.  °

## 2021-06-13 NOTE — Telephone Encounter (Signed)
Returned call to pt she states that she only wants to speak with Elnita Maxwell

## 2021-06-18 NOTE — Telephone Encounter (Signed)
Spoke to patient she stated she would like a follow up visit with Dr.Jordan before the end of year.Stated she will be faxing her FMLA paper work to be completed.Appointment scheduled with Dr.Jordan 10/27 at 3:30 pm.

## 2021-06-26 NOTE — Telephone Encounter (Signed)
Patient is following up. She would like to make sure the [facility] paperwork has been received. Please advise.

## 2021-06-26 NOTE — Telephone Encounter (Signed)
Message routed to [employer] regarding FMLA paperwork

## 2021-06-27 NOTE — Telephone Encounter (Signed)
Dr.Jordan nurse has patients FMLA forms.

## 2021-07-01 ENCOUNTER — Telehealth: Payer: Self-pay

## 2021-07-01 NOTE — Telephone Encounter (Signed)
[employer] FMLA form completed and faxed to fax # [phone].Copy mailed to patient.

## 2021-07-01 NOTE — Telephone Encounter (Signed)
See previous 8/22 telephone note.

## 2021-07-20 ENCOUNTER — Other Ambulatory Visit: Payer: Self-pay | Admitting: Cardiology

## 2021-07-20 DIAGNOSIS — I4891 Unspecified atrial fibrillation: Secondary | ICD-10-CM

## 2021-08-19 ENCOUNTER — Telehealth: Payer: Self-pay | Admitting: Cardiology

## 2021-08-19 NOTE — Telephone Encounter (Signed)
Patient called in asking to speak with the nurse. Please advise

## 2021-08-19 NOTE — Telephone Encounter (Signed)
Returned call to patient no answer.Unable to leave a message both voice mails full.

## 2021-08-19 NOTE — Telephone Encounter (Signed)
Returned call to patient who states that she was calling in to speak with Elnita Maxwell, LPN. Offered patient assistance but patient stated she would like to speak with Elnita Maxwell. Patient states that it is not an urgent matter. Advised patient I would forward message to [place]. Patient verbalized understanding.

## 2021-08-21 NOTE — Telephone Encounter (Signed)
Called patient no answer.Left message on personal voice mail to call back. 

## 2021-08-23 ENCOUNTER — Other Ambulatory Visit (HOSPITAL_BASED_OUTPATIENT_CLINIC_OR_DEPARTMENT_OTHER): Payer: Self-pay | Admitting: Cardiology

## 2021-08-23 DIAGNOSIS — I739 Peripheral vascular disease, unspecified: Secondary | ICD-10-CM

## 2021-08-29 ENCOUNTER — Telehealth: Payer: Self-pay | Admitting: Cardiology

## 2021-08-29 NOTE — Telephone Encounter (Signed)
Called patient and offered to assist her. She states she needs to talk to [facility] about her appointment and she would like to wait until she comes back in the office. I advised I will forward message to [facility] for follow-up and she thanked me for the call.

## 2021-08-29 NOTE — Telephone Encounter (Signed)
Patient would like for Dr. Elvis Coil nurse to giver her a call.

## 2021-08-30 NOTE — Telephone Encounter (Signed)
Follow Up:      Patient wants Traci Choi to please give her a call. Marland Kitchen

## 2021-08-30 NOTE — Telephone Encounter (Signed)
Spoke to patient she stated she needs to reschedule her appointment with Dr.Jordan.Stated she is doing good. Appointment rescheduled to 11/30 at 8:30 am.

## 2021-08-30 NOTE — Telephone Encounter (Signed)
See previous 10/21 telephone note.

## 2021-09-05 ENCOUNTER — Ambulatory Visit: Payer: BC Managed Care – PPO | Admitting: Cardiology

## 2021-10-02 NOTE — Progress Notes (Signed)
Traci Choi Date of Birth: 04/26/1961 Medical Record #[id number]  History of Present Illness: Traci Choi is seen today for followup CAD. She has a history of coronary disease, peripheral vascular disease, and COPD. She was admitted in August 2014 with an anterior STEMI. She had emergent stenting of the proximal LAD with DES. She had 70-80% disease in a small OM2 and PDA. Treated medically.  Ejection fraction was 35-40% at that time. Because of recurrent chest pain ECG changes she underwent repeat angiography during that hospitalization which showed continued stent patency. She did develop atrial fibrillation and was treated with amiodarone but this has since been discontinued. Follow up Echo in Dec. 2014 showed EF 40-45%. In March 2017 she had a Myoview study that showed fixed defects in the mid apical anterior and apical inferior walls. No ischemia. EF 55%.   In May 2017 she went to the hospital in [place] with recurrent chest and throat pain.She ruled out for MI. LDL was 81. CXR was normal. Cardiac cath was performed showing a patent stent in the LAD. There was 40-50% stenosis in first diagonal, second OM and PDA. She was treated for GERD and had resolution of her chest pain.  She continues to note memory loss. We stopped her lipitor to see if this was a factor but she noted no change. Lipitor was resumed.   She was  admitted at [facility] on 07/17/2016 for chest pain. She had negative troponin values. Echo showing normal echo with EF of 55-65% and no wall motion abnormalities. No significant valve abnormalities. She underwent a NST which showed a large, severe partially reversible defect involving the mid-apical anterior, anteroseptal, apical inferior, apical septal, and apical walls, thought to be consistent with previous infarction and peri-infarct ischemia. Overall interrpreted as moderate-risk. Images were not available for review. She was seen back here in September 2017 and May 2018  and was doing well on medical therapy.   She is followed at [facility] for COPD. PFTs in May 2018 showed worsening function and she was placed on Anoro. She has remote stenting of the distal abdominal aorta in the 1990s at [facility]. Dopplers in Jan 2022 showed patent stent and normal ABIs.  On follow up today she states that she is doing well. She still has SOB. Unchanged from prior. Will refill Anoro.    Minimal pedal edema. Weight is stable. Chest pain is unchanged minor symptoms rare now. Usually after she eats something she shouldn't.  She is walking 30 regularly. Notes some discomfort in calves with walking - worse going upstairs.   Current Outpatient Medications on File Prior to Visit  Medication Sig Dispense Refill   albuterol (VENTOLIN HFA) 108 (90 Base) MCG/ACT inhaler Inhale 2 puffs into the lungs every 6 (six) hours as needed for wheezing. 1 g 3   aspirin 81 MG chewable tablet Chew 1 tablet (81 mg total) by mouth daily.     atorvastatin (LIPITOR) 80 MG tablet TAKE 1 TABLET BY MOUTH ONCE DAILY. 90 tablet 3   benzonatate (TESSALON) 100 MG capsule Take 1 capsule (100 mg total) by mouth 2 (two) times daily. 30 capsule 3   bisoprolol (ZEBETA) 5 MG tablet Take 0.5 tablets (2.5 mg total) by mouth daily. 45 tablet 3   fluticasone (FLONASE) 50 MCG/ACT nasal spray Place 2 sprays into both nostrils daily as needed for allergies. 18.2 g 3   furosemide (LASIX) 20 MG tablet TAKE 1 TABLET BY MOUTH ONCE DAILY. 90 tablet 3  losartan (COZAAR) 25 MG tablet Take 1 tablet (25 mg total) by mouth daily. Pt needs to keep upcoming appt in Oct for further refills 90 tablet 2   nitroGLYCERIN (NITROSTAT) 0.4 MG SL tablet Place 1 tablet (0.4 mg total) under the tongue every 5 (five) minutes as needed for chest pain. 3 DOSES MAX 25 tablet 6   pantoprazole (PROTONIX) 40 MG tablet TAKE 1 TABLET BY MOUTH ONCE DAILY 30 tablet 8   potassium chloride (KLOR-CON) 10 MEQ tablet Take 1 tablet (10 mEq total) by mouth daily. Pt needs to  keep upcoming appt in Oct for further refills 90 tablet 0   traMADol (ULTRAM) 50 MG tablet Take 50 mg by mouth every 6 (six) hours as needed for moderate pain.      No current facility-administered medications on file prior to visit.    Allergies  Allergen Reactions   Metronidazole Rash   Sulfa Antibiotics Hives   Codeine Other (See Comments)    Other Reaction: GI Upset    Penicillins Nausea And Vomiting and Rash    Past Medical History:  Diagnosis Date   Chronic systolic CHF (congestive heart failure) (HCC)    a. echo 07/08/13: EF 35-40%, inferior, septal and apical HK;  b. 07/2016 Echo ([place], [place]): EF 55-65%, no signifiicant valvular abnormalities.   COPD (chronic obstructive pulmonary disease) (HCC)    followed at [facility]   Coronary artery disease    a. Anterior STEMI (8/14):  LHC - pLAD occluded (PCI: Promus premier 3x20 mm DES to), EF 35-40%;  b. Re-look cath (9/14):  PLAD stent ok, D2 50-70 (small)->med Rx c. cath 03/2016 patent pLAD stent, 50% D1, 50% OM2->Med Rx;  d. 07/2016 MV ([place]): large ant/apical defect w/ peri-inf ischemia->Med rx.   GERD (gastroesophageal reflux disease)    Hyperlipidemia    Ischemic cardiomyopathy    a. 07/08/13 Echo: EF 35-40%, inferior, septal and apical HK;  b. 07/2016 Echo ([place], [place]): EF 55-65%.   PAD (peripheral artery disease) (HCC)    a. 01/2014 ABI's ([facility]): normal.   PAF (paroxysmal atrial fibrillation) (HCC)    a. 06/2013 post MI => Amiodarone x 1 month-->not on long term OAC (CHA2DS2VASc = 3).    Past Surgical History:  Procedure Laterality Date   ABDOMINAL AORTA STENT     LEFT HEART CATHETERIZATION WITH CORONARY ANGIOGRAM Bilateral 07/08/2013   Procedure: LEFT HEART CATHETERIZATION WITH CORONARY ANGIOGRAM;  Surgeon: Nayib Remer M [place], MD;  Location: [facility] C[facility] LAB;  Service: Cardiovascular;  Laterality: Bilateral;   LEFT HEART CATHETERIZATION WITH CORONARY ANGIOGRAM N/A 07/12/2013   Procedure: LEFT HEART CATHETERIZATION WITH  CORONARY ANGIOGRAM;  Surgeon: Maleea Camilo M [place], MD;  Location[facility] CATH LAB;  Service: Cardiovascular;  Laterality: N/A;   PERCUTANEOUS STENT INTERVENTION  07/08/2013   Procedure: PERCUTANEOUS STENT INTERVENTION;  Surgeon: Syan Cullimore M [place], MD;  Location: [facility] CATH LAB;  Service: Cardiovascular;;  Prox LAD DES    Social History   Tobacco Use  Smoking Status Never  Smokeless Tobacco Former    Social History   Substance and Sexual Activity  Alcohol Use No    Family History  Problem Relation Age of Onset   Heart attack Mother     Review of Systems: As noted in history of present illness.  All other systems were reviewed and are negative.  Physical Exam: BP 126/61   Pulse 68   Ht 5' 0.5" (1.537 m)   Wt 195 lb 3.2 oz (88.5 kg)   SpO2 95%  BMI 37.49 kg/m  GENERAL:  Well appearing BF in NAD HEENT:  PERRL, EOMI, sclera are clear. Oropharynx is clear. NECK:  No jugular venous distention, carotid upstroke brisk and symmetric, no bruits, no thyromegaly or adenopathy LUNGS:  Clear to auscultation bilaterally CHEST:  Unremarkable HEART:  RRR,  PMI not displaced or sustained,S1 and S2 within normal limits, no S3, no S4: no clicks, no rubs, no murmurs ABD:  Soft, nontender. BS +, no masses or bruits. No hepatomegaly, no splenomegaly EXT:  2 + pulses throughout, no edema, no cyanosis no clubbing SKIN:  Warm and dry.  No rashes NEURO:  Alert and oriented x 3. Cranial nerves II through XII intact. PSYCH:  Cognitively intact   LABORATORY DATA: Lab Results  Component Value Date   WBC 8.4 05/28/2020   HGB 14.2 05/28/2020   HCT 43.5 05/28/2020   PLT 190 05/28/2020   GLUCOSE 94 05/28/2020   CHOL 155 05/28/2020   TRIG 62 05/28/2020   HDL 52 05/28/2020   LDLCALC 91 05/28/2020   ALT 18 05/28/2020   AST 25 05/28/2020   NA 138 05/28/2020   K 4.9 05/28/2020   CL 104 05/28/2020   CREATININE 0.62 05/28/2020   BUN 14 05/28/2020   CO2 22 05/28/2020   TSH 1.451 07/08/2013   INR 1.00  07/08/2013   HGBA1C 6.3 (H) 05/28/2020    Ecg today shows NSR with rate 68. Low voltage. Old septal infarct. No change from prior. I have personally reviewed and interpreted this study.   Assessment / Plan: 1. Coronary disease status post anterior STEMI in 2014. Status post stenting of the proximal LAD with drug-eluting stent in 2014.    Myoview study in March 2017showed no ischemia with some scar. EF 55%. Cardiac cath in May 2017 in [facility] showed no obstructive disease. Repeat Myoview there in September 2017 showed mostly scar. She has stable class 1 angina. Continue medical therapy with prn Ntg. Focus on lifestyle modification with heart healthy diet, weight loss, and exercise.   2. Ischemic cardiomyopathy. EF improved to 55% by Myoview. Also normal by last Cath. Stressed importance of sodium restriction.  She appears euvolemic today.  3. Atrial fibrillation at time of initial MI. No recurrence.   4.  Memory loss. Seen previously by Neuro. No change.  6. PAD. Status post distal abdominal aortic stenting in the past. Recent dopplers in Jan 2022 showed patent stents and normal ABIs. Will repeat in the new year  7. Hypercholesterolemia.  on lipitor. Will update lab work today  8. COPD- stable on Anoro with albuterol prn  9. Pre diabetes. Last A1c 6.3%. will repeat.   I will follow up in one year.

## 2021-10-09 ENCOUNTER — Ambulatory Visit: Payer: BC Managed Care – PPO | Admitting: Cardiology

## 2021-10-09 ENCOUNTER — Encounter: Payer: Self-pay | Admitting: Cardiology

## 2021-10-09 ENCOUNTER — Other Ambulatory Visit: Payer: Self-pay

## 2021-10-09 VITALS — BP 126/61 | HR 68 | Ht 60.5 in | Wt 195.2 lb

## 2021-10-09 DIAGNOSIS — I10 Essential (primary) hypertension: Secondary | ICD-10-CM

## 2021-10-09 DIAGNOSIS — I25119 Atherosclerotic heart disease of native coronary artery with unspecified angina pectoris: Secondary | ICD-10-CM

## 2021-10-09 DIAGNOSIS — I739 Peripheral vascular disease, unspecified: Secondary | ICD-10-CM

## 2021-10-09 DIAGNOSIS — J449 Chronic obstructive pulmonary disease, unspecified: Secondary | ICD-10-CM

## 2021-10-09 DIAGNOSIS — R7309 Other abnormal glucose: Secondary | ICD-10-CM

## 2021-10-09 LAB — LIPID PANEL
Chol/HDL Ratio: 3.3 ratio (ref 0.0–4.4)
Cholesterol, Total: 143 mg/dL (ref 100–199)
HDL: 43 mg/dL (ref >39)
LDL Chol Calc (NIH): 87 mg/dL (ref 0–99)
Triglycerides: 63 mg/dL (ref 0–149)
VLDL Cholesterol Cal: 13 mg/dL (ref 5–40)

## 2021-10-09 LAB — CBC WITH DIFFERENTIAL/PLATELET
Basophils Absolute: 0 10*3/uL (ref 0.0–0.2)
Basos: 1 %
EOS (ABSOLUTE): 0.1 10*3/uL (ref 0.0–0.4)
Eos: 1 %
Hematocrit: 40.2 % (ref 34.0–46.6)
Hemoglobin: 13 g/dL (ref 11.1–15.9)
Immature Grans (Abs): 0 10*3/uL (ref 0.0–0.1)
Immature Granulocytes: 1 %
Lymphocytes Absolute: 1.8 10*3/uL (ref 0.7–3.1)
Lymphs: 26 %
MCH: 29.3 pg (ref 26.6–33.0)
MCHC: 32.3 g/dL (ref 31.5–35.7)
MCV: 91 fL (ref 79–97)
Monocytes Absolute: 0.5 10*3/uL (ref 0.1–0.9)
Monocytes: 8 %
Neutrophils Absolute: 4.4 10*3/uL (ref 1.4–7.0)
Neutrophils: 63 %
Platelets: 233 10*3/uL (ref 150–450)
RBC: 4.44 x10E6/uL (ref 3.77–5.28)
RDW: 12.7 % (ref 11.7–15.4)
WBC: 6.8 10*3/uL (ref 3.4–10.8)

## 2021-10-09 LAB — HEMOGLOBIN A1C
Est. average glucose Bld gHb Est-mCnc: 143 mg/dL
Hgb A1c MFr Bld: 6.6 % — ABNORMAL HIGH (ref 4.8–5.6)

## 2021-10-09 LAB — BASIC METABOLIC PANEL
BUN/Creatinine Ratio: 19 (ref 12–28)
BUN: 11 mg/dL (ref 8–27)
CO2: 24 mmol/L (ref 20–29)
Calcium: 8.6 mg/dL — ABNORMAL LOW (ref 8.7–10.3)
Chloride: 104 mmol/L (ref 96–106)
Creatinine, Ser: 0.57 mg/dL (ref 0.57–1.00)
Glucose: 120 mg/dL — ABNORMAL HIGH (ref 70–99)
Potassium: 4.4 mmol/L (ref 3.5–5.2)
Sodium: 139 mmol/L (ref 134–144)
eGFR: 103 mL/min/{1.73_m2} (ref >59)

## 2021-10-09 LAB — HEPATIC FUNCTION PANEL
ALT: 18 IU/L (ref 0–32)
AST: 17 IU/L (ref 0–40)
Albumin: 3.8 g/dL (ref 3.8–4.8)
Alkaline Phosphatase: 89 IU/L (ref 44–121)
Bilirubin Total: 0.6 mg/dL (ref 0.0–1.2)
Bilirubin, Direct: 0.15 mg/dL (ref 0.00–0.40)
Total Protein: 7 g/dL (ref 6.0–8.5)

## 2021-10-09 MED ORDER — ANORO ELLIPTA 62.5-25 MCG/ACT IN AEPB: 1.0000 | INHALATION_SPRAY | Freq: Every day | RESPIRATORY_TRACT | 6 refills | Status: DC

## 2021-10-09 NOTE — Patient Instructions (Signed)
Medication Instructions:  Continue same medications *If you need a refill on your cardiac medications before your next appointment, please call your pharmacy*   Lab Work: Bmet,lipid and hepatic panels,cbc,a1c today    Testing/Procedures: Schedule ABI with and without TBI  11/2021 Schedule aorta/iliacs  11/2021   Follow-Up: At [facility], you and your health needs are our priority.  As part of our continuing mission to provide you with exceptional heart care, we have created designated Provider Care Teams.  These Care Teams include your primary Cardiologist (physician) and Advanced Practice Providers (APPs -  Physician Assistants and Nurse Practitioners) who all work together to provide you with the care you need, when you need it.  We recommend signing up for the patient portal called "MyChart".  Sign up information is provided on this After Visit Summary.  MyChart is used to connect with patients for Virtual Visits (Telemedicine).  Patients are able to view lab/test results, encounter notes, upcoming appointments, etc.  Non-urgent messages can be sent to your provider as well.   To learn more about what you can do with MyChart, go to [email or web address].      Your next appointment:  1 year   Call in July to schedule Nov appointment     The format for your next appointment:  Office   Provider:  Dr.Jordan

## 2021-10-17 ENCOUNTER — Other Ambulatory Visit: Payer: Self-pay

## 2021-10-17 DIAGNOSIS — E785 Hyperlipidemia, unspecified: Secondary | ICD-10-CM

## 2021-10-17 DIAGNOSIS — I2510 Atherosclerotic heart disease of native coronary artery without angina pectoris: Secondary | ICD-10-CM

## 2021-10-17 MED ORDER — EZETIMIBE 10 MG PO TABS: 10.0000 mg | ORAL_TABLET | Freq: Every day | ORAL | 3 refills | Status: DC

## 2021-10-17 NOTE — Progress Notes (Signed)
Zetia

## 2021-11-05 ENCOUNTER — Other Ambulatory Visit: Payer: Self-pay | Admitting: Cardiology

## 2021-11-05 DIAGNOSIS — I4891 Unspecified atrial fibrillation: Secondary | ICD-10-CM

## 2021-11-15 ENCOUNTER — Ambulatory Visit: Payer: BC Managed Care – PPO | Admitting: Cardiology

## 2021-11-17 ENCOUNTER — Other Ambulatory Visit: Payer: Self-pay | Admitting: Cardiology

## 2021-11-17 DIAGNOSIS — I5022 Chronic systolic (congestive) heart failure: Secondary | ICD-10-CM

## 2021-11-17 DIAGNOSIS — I4891 Unspecified atrial fibrillation: Secondary | ICD-10-CM

## 2021-11-18 ENCOUNTER — Ambulatory Visit (HOSPITAL_COMMUNITY): Payer: BC Managed Care – PPO

## 2021-12-04 ENCOUNTER — Ambulatory Visit (HOSPITAL_BASED_OUTPATIENT_CLINIC_OR_DEPARTMENT_OTHER)
Admission: RE | Admit: 2021-12-04 | Discharge: 2021-12-04 | Disposition: A | Discharge status: Home / Self Care | Payer: BC Managed Care – PPO | Source: Ambulatory Visit | Attending: Cardiovascular Disease | Admitting: Cardiovascular Disease

## 2021-12-04 ENCOUNTER — Other Ambulatory Visit: Payer: Self-pay

## 2021-12-04 ENCOUNTER — Ambulatory Visit (HOSPITAL_COMMUNITY)
Admission: RE | Admit: 2021-12-04 | Discharge: 2021-12-04 | Disposition: A | Discharge status: Home / Self Care | Payer: BC Managed Care – PPO | Source: Ambulatory Visit | Attending: Cardiovascular Disease | Admitting: Cardiovascular Disease

## 2021-12-04 ENCOUNTER — Telehealth: Payer: Self-pay

## 2021-12-04 DIAGNOSIS — I2510 Atherosclerotic heart disease of native coronary artery without angina pectoris: Secondary | ICD-10-CM

## 2021-12-04 DIAGNOSIS — I739 Peripheral vascular disease, unspecified: Secondary | ICD-10-CM | POA: Insufficient documentation

## 2021-12-04 DIAGNOSIS — M79662 Pain in left lower leg: Secondary | ICD-10-CM

## 2021-12-04 DIAGNOSIS — E785 Hyperlipidemia, unspecified: Secondary | ICD-10-CM

## 2021-12-04 DIAGNOSIS — M79661 Pain in right lower leg: Secondary | ICD-10-CM

## 2021-12-04 NOTE — Telephone Encounter (Signed)
Spoke to patient she will repeat fasting lipid and hepatic panels in March.Lab orders mailed.

## 2021-12-18 ENCOUNTER — Other Ambulatory Visit: Payer: Self-pay

## 2022-02-01 ENCOUNTER — Other Ambulatory Visit: Payer: Self-pay | Admitting: Cardiology

## 2022-04-20 ENCOUNTER — Other Ambulatory Visit: Payer: Self-pay | Admitting: Cardiology

## 2022-04-30 LAB — HEPATIC FUNCTION PANEL
ALT: 23 IU/L (ref 0–32)
AST: 18 IU/L (ref 0–40)
Albumin: 3.9 g/dL (ref 3.8–4.8)
Alkaline Phosphatase: 104 IU/L (ref 44–121)
Bilirubin Total: 0.4 mg/dL (ref 0.0–1.2)
Bilirubin, Direct: 0.14 mg/dL (ref 0.00–0.40)
Total Protein: 7.4 g/dL (ref 6.0–8.5)

## 2022-04-30 LAB — LIPID PANEL
Chol/HDL Ratio: 2.6 ratio (ref 0.0–4.4)
Cholesterol, Total: 128 mg/dL (ref 100–199)
HDL: 50 mg/dL (ref >39)
LDL Chol Calc (NIH): 66 mg/dL (ref 0–99)
Triglycerides: 56 mg/dL (ref 0–149)
VLDL Cholesterol Cal: 12 mg/dL (ref 5–40)

## 2022-05-08 ENCOUNTER — Other Ambulatory Visit: Payer: Self-pay | Admitting: Cardiology

## 2022-09-02 ENCOUNTER — Other Ambulatory Visit: Payer: Self-pay | Admitting: Cardiology

## 2022-09-02 NOTE — Telephone Encounter (Signed)
Rx refill sent to pharmacy. 

## 2022-09-29 ENCOUNTER — Other Ambulatory Visit: Payer: Self-pay | Admitting: Cardiology

## 2022-10-15 NOTE — Progress Notes (Signed)
Traci Choi Date of Birth: 1961-08-12 Medical Record #[id number]  History of Present Illness: Mrs. Allport is seen today for followup CAD. She has a history of coronary disease, peripheral vascular disease, and COPD. She was admitted in August 2014 with an anterior STEMI. She had emergent stenting of the proximal LAD with DES. She had 70-80% disease in a small OM2 and PDA. Treated medically.  Ejection fraction was 35-40% at that time. Because of recurrent chest pain ECG changes she underwent repeat angiography during that hospitalization which showed continued stent patency. She did develop atrial fibrillation and was treated with amiodarone but this has since been discontinued. Follow up Echo in Dec. 2014 showed EF 40-45%. In March 2017 she had a Myoview study that showed fixed defects in the mid apical anterior and apical inferior walls. No ischemia. EF 55%.   In May 2017 she went to the hospital in [place] with recurrent chest and throat pain.She ruled out for MI. LDL was 81. CXR was normal. Cardiac cath was performed showing a patent stent in the LAD. There was 40-50% stenosis in first diagonal, second OM and PDA. She was treated for GERD and had resolution of her chest pain.  She continues to note memory loss. We stopped her lipitor to see if this was a factor but she noted no change. Lipitor was resumed.   She was  admitted at [facility] on 07/17/2016 for chest pain. She had negative troponin values. Echo showing normal echo with EF of 55-65% and no wall motion abnormalities. No significant valve abnormalities. She underwent a NST which showed a large, severe partially reversible defect involving the mid-apical anterior, anteroseptal, apical inferior, apical septal, and apical walls, thought to be consistent with previous infarction and peri-infarct ischemia. Overall interrpreted as moderate-risk. Images were not available for review. She was seen back here in September 2017 and May 2018  and was doing well on medical therapy.   She is followed at [facility] for COPD. PFTs in May 2018 showed worsening function and she was placed on Anoro. She has remote stenting of the distal abdominal aorta in the 1990s at [facility]. Dopplers in Jan 2022 showed patent stent and normal ABIs. Repeat here in January 2023 stable.   On follow up today she states that she is doing well. She still has SOB especially going up stairs. Unchanged from prior.   Denies any chest pain. She is walking only about once a week. No change in claudication. She still notes issues with memory.  Current Outpatient Medications on File Prior to Visit  Medication Sig Dispense Refill   aspirin 81 MG chewable tablet Chew 1 tablet (81 mg total) by mouth daily.     atorvastatin (LIPITOR) 80 MG tablet TAKE 1 TABLET BY MOUTH ONCE DAILY. 90 tablet 3   benzonatate (TESSALON) 100 MG capsule TAKE ONE CAPSULE BY MOUTH TWICE DAILY 30 capsule 1   bisoprolol (ZEBETA) 5 MG tablet TAKE ONE-HALF TABLET BY MOUTH DAILY 45 tablet 3   ezetimibe (ZETIA) 10 MG tablet TAKE ONE TABLET BY MOUTH DAILY 90 tablet 3   furosemide (LASIX) 20 MG tablet TAKE 1 TABLET BY MOUTH ONCE DAILY. 90 tablet 3   losartan (COZAAR) 25 MG tablet TAKE 1 TABLET BY MOUTH ONCE DAILY., PLEASE KEEP UPCOMING APPT IN OCT 90 tablet 3   nitroGLYCERIN (NITROSTAT) 0.4 MG SL tablet Place 1 tablet (0.4 mg total) under the tongue every 5 (five) minutes as needed for chest pain. 3 DOSES MAX 25  tablet 6   pantoprazole (PROTONIX) 40 MG tablet Take 1 tablet (40 mg total) by mouth daily. 30 tablet 1   potassium chloride (KLOR-CON) 10 MEQ tablet TAKE 1 TABLET BY MOUTH ONCE DAILY. 90 tablet 3   traMADol (ULTRAM) 50 MG tablet Take 50 mg by mouth every 6 (six) hours as needed for moderate pain.      No current facility-administered medications on file prior to visit.    Allergies  Allergen Reactions   Metronidazole Rash   Sulfa Antibiotics Hives   Codeine Other (See Comments)    Other Reaction:  GI Upset    Penicillins Nausea And Vomiting and Rash    Past Medical History:  Diagnosis Date   Chronic systolic CHF (congestive heart failure) (HCC)    a. echo 07/08/13: EF 35-40%, inferior, septal and apical HK;  b. 07/2016 Echo ([place], [place]): EF 55-65%, no signifiicant valvular abnormalities.   COPD (chronic obstructive pulmonary disease) (HCC)    followed at [facility]   Coronary artery disease    a. Anterior STEMI (8/14):  LHC - pLAD occluded (PCI: Promus premier 3x20 mm DES to), EF 35-40%;  b. Re-look cath (9/14):  PLAD stent ok, D2 50-70 (small)->med Rx c. cath 03/2016 patent pLAD stent, 50% D1, 50% OM2->Med Rx;  d. 07/2016 MV ([place]): large ant/apical defect w/ peri-inf ischemia->Med rx.   GERD (gastroesophageal reflux disease)    Hyperlipidemia    Ischemic cardiomyopathy    a. 07/08/13 Echo: EF 35-40%, inferior, septal and apical HK;  b. 07/2016 Echo ([place], [place]): EF 55-65%.   PAD (peripheral artery disease) (HCC)    a. 01/2014 ABI's ([facility]): normal.   PAF (paroxysmal atrial fibrillation) (HCC)    a. 06/2013 post MI => Amiodarone x 1 month-->not on long term OAC (CHA2DS2VASc = 3).    Past Surgical History:  Procedure Laterality Date   ABDOMINAL AORTA STENT     LEFT HEART CATHETERIZATION WITH CORONARY ANGIOGRAM Bilateral 07/08/2013   Procedure: LEFT HEART CATHETERIZATION WITH CORONARY ANGIOGRAM;  Surgeon: Jaelynne Hockley M [place], MD;  Location: [facility] CATH LAB;  Service: Cardiovascular;  Laterality: Bilateral;   LEFT HEART CATHETERIZATION WITH CORONARY ANGIOGRAM N/A 07/12/2013   Procedure: LEFT HEART CATHETERIZATION WITH CORONARY ANGIOGRAM;  Surgeon: Federick Levene M [place], MD;  Location: [facility] CATH LAB;  Service: Cardiovascular;  Laterality: N/A;   PERCUTANEOUS STENT INTERVENTION  07/08/2013   Procedure: PERCUTANEOUS STENT INTERVENTION;  Surgeon: Abad Manard M [place], MD;  Location: [facility] CATH LAB;  Service: Cardiovascular;;  Prox LAD DES    Social History   Tobacco Use  Smoking Status Never  Smokeless  Tobacco Former    Social History   Substance and Sexual Activity  Alcohol Use No    Family History  Problem Relation Age of Onset   Heart attack Mother     Review of Systems: As noted in history of present illness.  All other systems were reviewed and are negative.  Physical Exam: BP 136/80   Pulse 68   Ht 5' (1.524 m)   Wt 198 lb 3.2 oz (89.9 kg)   SpO2 100%   BMI 38.71 kg/m  GENERAL:  Well appearing BF in NAD HEENT:  PERRL, EOMI, sclera are clear. Oropharynx is clear. NECK:  No jugular venous distention, carotid upstroke brisk and symmetric, no bruits, no thyromegaly or adenopathy LUNGS:  Clear to auscultation bilaterally CHEST:  Unremarkable HEART:  RRR,  PMI not displaced or sustained,S1 and S2 within normal limits, no S3, no S4: no clicks, no  rubs, no murmurs ABD:  Soft, nontender. BS +, no masses or bruits. No hepatomegaly, no splenomegaly EXT:  2 + pulses throughout, no edema, no cyanosis no clubbing SKIN:  Warm and dry.  No rashes NEURO:  Alert and oriented x 3. Cranial nerves II through XII intact. PSYCH:  Cognitively intact   LABORATORY DATA: Lab Results  Component Value Date   WBC 6.8 10/09/2021   HGB 13.0 10/09/2021   HCT 40.2 10/09/2021   PLT 233 10/09/2021   GLUCOSE 120 (H) 10/09/2021   CHOL 128 04/30/2022   TRIG 56 04/30/2022   HDL 50 04/30/2022   LDLCALC 66 04/30/2022   ALT 23 04/30/2022   AST 18 04/30/2022   NA 139 10/09/2021   K 4.4 10/09/2021   CL 104 10/09/2021   CREATININE 0.57 10/09/2021   BUN 11 10/09/2021   CO2 24 10/09/2021   TSH 1.451 07/08/2013   INR 1.00 07/08/2013   HGBA1C 6.6 (H) 10/09/2021    Ecg today shows NSR with rate 68. Low voltage. Old septal infarct. No change from prior. I have personally reviewed and interpreted this study.   Assessment / Plan: 1. Coronary disease status post anterior STEMI in 2014. Status post stenting of the proximal LAD with drug-eluting stent in 2014.    Myoview study in March 2017showed  no ischemia with some scar. EF 55%. Cardiac cath in May 2017 in [facility] showed no obstructive disease. Repeat Myoview there in September 2017 showed mostly scar. She has stable class 1 angina. Continue medical therapy with prn Ntg. Focus on lifestyle modification with heart healthy diet, weight loss, and exercise.   2. Ischemic cardiomyopathy. EF improved to 55% by Myoview. Also normal by last Cath. Stressed importance of sodium restriction.  She appears euvolemic today.  3. Atrial fibrillation at time of initial MI. No recurrence.   4.  Memory loss. Seen previously by Neuro. No change.  6. PAD. Status post distal abdominal aortic stenting in the past. Recent dopplers in Jan 2023 showed patent stents and normal ABIs.  7. Hypercholesterolemia.  on lipitor. Will update lab work today  8. COPD- stable on Anoro with albuterol prn. refilled  9. Pre diabetes. Last A1c 6.6%. will repeat.   I will follow up in one year. Patient needs to get established with primary care physician.

## 2022-10-20 ENCOUNTER — Ambulatory Visit: Payer: BC Managed Care – PPO | Attending: Cardiology | Admitting: Cardiology

## 2022-10-20 ENCOUNTER — Encounter: Payer: Self-pay | Admitting: Cardiology

## 2022-10-20 VITALS — BP 136/80 | HR 68 | Ht 60.0 in | Wt 198.2 lb

## 2022-10-20 DIAGNOSIS — E785 Hyperlipidemia, unspecified: Secondary | ICD-10-CM

## 2022-10-20 DIAGNOSIS — J449 Chronic obstructive pulmonary disease, unspecified: Secondary | ICD-10-CM

## 2022-10-20 DIAGNOSIS — I10 Essential (primary) hypertension: Secondary | ICD-10-CM

## 2022-10-20 DIAGNOSIS — I25119 Atherosclerotic heart disease of native coronary artery with unspecified angina pectoris: Secondary | ICD-10-CM | POA: Diagnosis not present

## 2022-10-20 DIAGNOSIS — I739 Peripheral vascular disease, unspecified: Secondary | ICD-10-CM | POA: Diagnosis not present

## 2022-10-20 DIAGNOSIS — R0609 Other forms of dyspnea: Secondary | ICD-10-CM

## 2022-10-20 MED ORDER — ALBUTEROL SULFATE HFA 108 (90 BASE) MCG/ACT IN AERS: 2.0000 | INHALATION_SPRAY | Freq: Four times a day (QID) | RESPIRATORY_TRACT | 3 refills | Status: AC | PRN

## 2022-10-20 MED ORDER — ANORO ELLIPTA 62.5-25 MCG/ACT IN AEPB: 1.0000 | INHALATION_SPRAY | Freq: Every day | RESPIRATORY_TRACT | 6 refills | Status: DC

## 2022-10-20 MED ORDER — FLUTICASONE PROPIONATE 50 MCG/ACT NA SUSP: 2.0000 | Freq: Every day | NASAL | 3 refills | Status: AC | PRN

## 2022-10-20 NOTE — Addendum Note (Signed)
Addended by: Neoma Laming on: 10/20/2022 08:28 AM   Modules accepted: Orders

## 2022-10-21 ENCOUNTER — Encounter: Payer: Self-pay | Admitting: *Deleted

## 2022-10-21 LAB — COMPREHENSIVE METABOLIC PANEL
ALT: 32 IU/L (ref 0–32)
AST: 28 IU/L (ref 0–40)
Albumin/Globulin Ratio: 1.3 (ref 1.2–2.2)
Albumin: 4 g/dL (ref 3.9–4.9)
Alkaline Phosphatase: 93 IU/L (ref 44–121)
BUN/Creatinine Ratio: 20 (ref 12–28)
BUN: 14 mg/dL (ref 8–27)
Bilirubin Total: 0.4 mg/dL (ref 0.0–1.2)
CO2: 25 mmol/L (ref 20–29)
Calcium: 8.7 mg/dL (ref 8.7–10.3)
Chloride: 106 mmol/L (ref 96–106)
Creatinine, Ser: 0.69 mg/dL (ref 0.57–1.00)
Globulin, Total: 3.2 g/dL (ref 1.5–4.5)
Glucose: 110 mg/dL — ABNORMAL HIGH (ref 70–99)
Potassium: 4.6 mmol/L (ref 3.5–5.2)
Sodium: 141 mmol/L (ref 134–144)
Total Protein: 7.2 g/dL (ref 6.0–8.5)
eGFR: 97 mL/min/{1.73_m2} (ref >59)

## 2022-10-21 LAB — CBC WITH DIFFERENTIAL/PLATELET
Basophils Absolute: 0.1 10*3/uL (ref 0.0–0.2)
Basos: 1 %
EOS (ABSOLUTE): 0.1 10*3/uL (ref 0.0–0.4)
Eos: 1 %
Hematocrit: 41.1 % (ref 34.0–46.6)
Hemoglobin: 13.3 g/dL (ref 11.1–15.9)
Immature Grans (Abs): 0 10*3/uL (ref 0.0–0.1)
Immature Granulocytes: 0 %
Lymphocytes Absolute: 1.9 10*3/uL (ref 0.7–3.1)
Lymphs: 30 %
MCH: 30 pg (ref 26.6–33.0)
MCHC: 32.4 g/dL (ref 31.5–35.7)
MCV: 93 fL (ref 79–97)
Monocytes Absolute: 0.7 10*3/uL (ref 0.1–0.9)
Monocytes: 11 %
Neutrophils Absolute: 3.8 10*3/uL (ref 1.4–7.0)
Neutrophils: 57 %
Platelets: 236 10*3/uL (ref 150–450)
RBC: 4.44 x10E6/uL (ref 3.77–5.28)
RDW: 13 % (ref 11.7–15.4)
WBC: 6.6 10*3/uL (ref 3.4–10.8)

## 2022-10-21 LAB — HEMOGLOBIN A1C
Est. average glucose Bld gHb Est-mCnc: 140 mg/dL
Hgb A1c MFr Bld: 6.5 % — ABNORMAL HIGH (ref 4.8–5.6)

## 2022-10-21 LAB — LIPID PANEL
Chol/HDL Ratio: 2.6 ratio (ref 0.0–4.4)
Cholesterol, Total: 133 mg/dL (ref 100–199)
HDL: 51 mg/dL (ref >39)
LDL Chol Calc (NIH): 71 mg/dL (ref 0–99)
Triglycerides: 49 mg/dL (ref 0–149)
VLDL Cholesterol Cal: 11 mg/dL (ref 5–40)

## 2022-10-30 ENCOUNTER — Other Ambulatory Visit: Payer: Self-pay | Admitting: Cardiology

## 2022-11-20 ENCOUNTER — Other Ambulatory Visit: Payer: Self-pay | Admitting: Cardiology

## 2022-12-17 ENCOUNTER — Other Ambulatory Visit: Payer: Self-pay | Admitting: Cardiology

## 2022-12-25 ENCOUNTER — Other Ambulatory Visit: Payer: Self-pay | Admitting: Cardiology

## 2022-12-25 DIAGNOSIS — I5022 Chronic systolic (congestive) heart failure: Secondary | ICD-10-CM

## 2023-03-05 ENCOUNTER — Other Ambulatory Visit: Payer: Self-pay | Admitting: Cardiology

## 2023-03-05 DIAGNOSIS — I4891 Unspecified atrial fibrillation: Secondary | ICD-10-CM

## 2023-05-04 ENCOUNTER — Other Ambulatory Visit: Payer: Self-pay | Admitting: Cardiology

## 2023-06-17 ENCOUNTER — Other Ambulatory Visit: Payer: Self-pay | Admitting: Cardiology

## 2023-06-21 ENCOUNTER — Other Ambulatory Visit: Payer: Self-pay | Admitting: Cardiology

## 2023-09-13 ENCOUNTER — Other Ambulatory Visit: Payer: Self-pay | Admitting: Cardiology

## 2023-10-05 ENCOUNTER — Other Ambulatory Visit: Payer: Self-pay | Admitting: Cardiology

## 2023-10-14 ENCOUNTER — Other Ambulatory Visit: Payer: Self-pay | Admitting: Cardiology

## 2023-10-16 ENCOUNTER — Ambulatory Visit: Payer: BC Managed Care – PPO | Admitting: Cardiology

## 2023-10-21 NOTE — Progress Notes (Signed)
Traci Choi Date of Birth: 08-15-1961 Medical Record #[id number]  History of Present Illness: Traci Choi is seen today for followup CAD. She has a history of coronary disease, peripheral vascular disease, and COPD. She was admitted in August 2014 with an anterior STEMI. She had emergent stenting of the proximal LAD with DES. She had 70-80% disease in a small OM2 and PDA. Treated medically.  Ejection fraction was 35-40% at that time. Because of recurrent chest pain ECG changes she underwent repeat angiography during that hospitalization which showed continued stent patency. She did develop atrial fibrillation and was treated with amiodarone but this has since been discontinued. Follow up Echo in Dec. 2014 showed EF 40-45%. In March 2017 she had a Myoview study that showed fixed defects in the mid apical anterior and apical inferior walls. No ischemia. EF 55%.   In May 2017 she went to the hospital in [place] with recurrent chest and throat pain.She ruled out for MI. LDL was 81. CXR was normal. Cardiac cath was performed showing a patent stent in the LAD. There was 40-50% stenosis in first diagonal, second OM and PDA. She was treated for GERD and had resolution of her chest pain.  She continues to note memory loss. We stopped her lipitor to see if this was a factor but she noted no change. Lipitor was resumed.   She was  admitted at [facility] on 07/17/2016 for chest pain. She had negative troponin values. Echo showing normal echo with EF of 55-65% and no wall motion abnormalities. No significant valve abnormalities. She underwent a NST which showed a large, severe partially reversible defect involving the mid-apical anterior, anteroseptal, apical inferior, apical septal, and apical walls, thought to be consistent with previous infarction and peri-infarct ischemia. Overall interrpreted as moderate-risk. Images were not available for review. She was seen back here in September 2017 and May 2018  and was doing well on medical therapy.   She was followed at [facility] for COPD in the past. PFTs in May 2018 showed worsening function and she was placed on Anoro. She has remote stenting of the distal abdominal aorta in the 1990s at [facility]. Dopplers in Jan 2022 showed patent stent and normal ABIs. Repeat here in January 2023 stable.   On follow up today she states that she is doing well. She did note 6 weeks ago she had chest pain. Took 2 sl Ntg and went to bed and symptoms resolved. She had another episode of chest pain 2 weeks later again resolved with sl Ntg x 2. No pain since then. She still has SOB especially going up stairs. Unchanged from prior.  No change in claudication. She hasn't established with PCP yet.   Current Outpatient Medications on File Prior to Visit  Medication Sig Dispense Refill   albuterol (VENTOLIN HFA) 108 (90 Base) MCG/ACT inhaler Inhale 2 puffs into the lungs every 6 (six) hours as needed for wheezing. 1 g 3   aspirin 81 MG chewable tablet Chew 1 tablet (81 mg total) by mouth daily.     benzonatate (TESSALON) 100 MG capsule TAKE 1 CAPSULE BY MOUTH TWICE DAILY 30 capsule 1   ezetimibe (ZETIA) 10 MG tablet TAKE ONE TABLET BY MOUTH DAILY 90 tablet 3   fluticasone (FLONASE) 50 MCG/ACT nasal spray Place 2 sprays into both nostrils daily as needed for allergies. 18.2 g 3   furosemide (LASIX) 20 MG tablet TAKE 1 TABLET BY MOUTH ONCE DAILY. 90 tablet 3   pantoprazole (PROTONIX)  40 MG tablet TAKE 1 TABLET BY MOUTH ONCE DAILY 30 tablet 6   potassium chloride (KLOR-CON) 10 MEQ tablet TAKE 1 TABLET BY MOUTH ONCE DAILY. 90 tablet 3   No current facility-administered medications on file prior to visit.    Allergies  Allergen Reactions   Metronidazole Rash   Sulfa Antibiotics Hives   Codeine Other (See Comments)    Other Reaction: GI Upset    Penicillins Nausea And Vomiting and Rash    Past Medical History:  Diagnosis Date   Chronic systolic CHF (congestive heart failure)  (HCC)    a. echo 07/08/13: EF 35-40%, inferior, septal and apical HK;  b. 07/2016 Echo ([place], [place]): EF 55-65%, no signifiicant valvular abnormalities.   COPD (chronic obstructive pulmonary disease) (HCC)    followed at [facility]   Coronary artery disease    a. Anterior STEMI (8/14):  LHC - pLAD occluded (PCI: Promus premier 3x20 mm DES to), EF 35-40%;  b. Re-look cath (9/14):  PLAD stent ok, D2 50-70 (small)->med Rx c. cath 03/2016 patent pLAD stent, 50% D1, 50% OM2->Med Rx;  d. 07/2016 MV ([place]): large ant/apical defect w/ peri-inf ischemia->Med rx.   GERD (gastroesophageal reflux disease)    Hyperlipidemia    Ischemic cardiomyopathy    a. 07/08/13 Echo: EF 35-40%, inferior, septal and apical HK;  b. 07/2016 Echo ([place], [place]): EF 55-65%.   PAD (peripheral artery disease) (HCC)    a. 01/2014 ABI's ([facility]): normal.   PAF (paroxysmal atrial fibrillation) (HCC)    a. 06/2013 post MI => Amiodarone x 1 month-->not on long term OAC (CHA2DS2VASc = 3).    Past Surgical History:  Procedure Laterality Date   ABDOMINAL AORTA STENT     LEFT HEART CATHETERIZATION WITH CORONARY ANGIOGRAM Bilateral 07/08/2013   Procedure: LEFT HEART CATHETERIZATION WITH CORONARY ANGIOGRAM;  Surgeon: Breyden Jeudy M [place], MD;  Location: [facility] CATH LAB;  Service: Cardiovascular;  Laterality: Bilateral;   LEFT HEART CATHETERIZATION WITH CORONARY ANGIOGRAM N/A 07/12/2013   Procedure: LEFT HEART CATHETERIZATION WITH CORONARY ANGIOGRAM;  Surgeon: Yetta Marceaux M [place], MD;  Location: [facility] CATH LAB;  Service: Cardiovascular;  Laterality: N/A;   PERCUTANEOUS STENT INTERVENTION  07/08/2013   Procedure: PERCUTANEOUS STENT INTERVENTION;  Surgeon: Janelle Culton M [place], MD;  Location: [facility] CATH LAB;  Service: Cardiovascular;;  Prox LAD DES    Social History   Tobacco Use  Smoking Status Never  Smokeless Tobacco Former    Social History   Substance and Sexual Activity  Alcohol Use No    Family History  Problem Relation Age of Onset   Heart attack  Mother     Review of Systems: As noted in history of present illness.  All other systems were reviewed and are negative.  Physical Exam: BP 132/77 (BP Location: Left Arm, Patient Position: Sitting, Cuff Size: Normal)   Pulse (!) 51   Ht 5' (1.524 m)   Wt 191 lb (86.6 kg)   BMI 37.30 kg/m  GENERAL:  Well appearing BF in NAD HEENT:  PERRL, EOMI, sclera are clear. Oropharynx is clear. NECK:  No jugular venous distention, carotid upstroke brisk and symmetric, no bruits, no thyromegaly or adenopathy LUNGS:  Clear to auscultation bilaterally CHEST:  Unremarkable HEART:  RRR,  PMI not displaced or sustained,S1 and S2 within normal limits, no S3, no S4: no clicks, no rubs, no murmurs ABD:  Soft, nontender. BS +, no masses or bruits. No hepatomegaly, no splenomegaly EXT:  2 + pulses throughout, no edema, no cyanosis  no clubbing SKIN:  Warm and dry.  No rashes NEURO:  Alert and oriented x 3. Cranial nerves II through XII intact. PSYCH:  Cognitively intact   LABORATORY DATA: Lab Results  Component Value Date   WBC 6.6 10/20/2022   HGB 13.3 10/20/2022   HCT 41.1 10/20/2022   PLT 236 10/20/2022   GLUCOSE 110 (H) 10/20/2022   CHOL 133 10/20/2022   TRIG 49 10/20/2022   HDL 51 10/20/2022   LDLCALC 71 10/20/2022   ALT 32 10/20/2022   AST 28 10/20/2022   NA 141 10/20/2022   K 4.6 10/20/2022   CL 106 10/20/2022   CREATININE 0.69 10/20/2022   BUN 14 10/20/2022   CO2 25 10/20/2022   TSH 1.451 07/08/2013   INR 1.00 07/08/2013   HGBA1C 6.5 (H) 10/20/2022    EKG Interpretation Date/Time:  Monday October 26 2023 09:33:21 EST Ventricular Rate:  51 PR Interval:  156 QRS Duration:  72 QT Interval:  434 QTC Calculation: 400 R Axis:   -16  Text Interpretation: Sinus bradycardia with sinus arrhythmia Low voltage QRS Septal infarct (cited on or before 08-Jul-2013) When compared with ECG of 12-Jul-2013 11:05, Non-specific change in ST segment in Anterior leads Nonspecific T wave  abnormality, improved in Inferior leads Nonspecific T wave abnormality now evident in Lateral leads Confirmed by [place], Anastasija Anfinson [phone]) on 10/26/2023 9:35:32 AM     Assessment / Plan: 1. Coronary disease status post anterior STEMI in 2014. Status post stenting of the proximal LAD with drug-eluting stent in 2014.    Myoview study in March 2017showed no ischemia with some scar. EF 55%. Cardiac cath in May 2017 in [facility] showed no obstructive disease. Repeat Myoview there in September 2017 showed mostly scar. She did have 2 episodes of angina 6 weeks ago. Recommend adding imdur 30 mg daily. Continue other medical therapy with prn Ntg. Focus on lifestyle modification with heart healthy diet, weight loss, and exercise. If she does have recurrent angina would need to consider repeat cardiac cath since stress testing is going to be abnormal  2. Ischemic cardiomyopathy. EF improved to 55% by Myoview. Also normal by last Cath. Stressed importance of sodium restriction.  She appears euvolemic today.  3. Atrial fibrillation at time of initial MI. No recurrence.   4.  Memory loss. Seen previously by Neuro. No change.  6. PAD. Status post distal abdominal aortic stenting in the past. Recent dopplers in Jan 2023 showed patent stents and normal ABIs.  7. Hypercholesterolemia.  on lipitor and Zetia. Last LDL 71 in Dec 2023. Will update labs today  8. COPD- stable on Anoro with albuterol prn. refilled  9. Pre diabetes. Last A1c 6.5%. will repeat.   I will follow up in one year. Patient needs to get established with primary care physician.

## 2023-10-26 ENCOUNTER — Ambulatory Visit: Payer: BC Managed Care – PPO | Attending: Cardiology | Admitting: Cardiology

## 2023-10-26 ENCOUNTER — Encounter: Payer: Self-pay | Admitting: Cardiology

## 2023-10-26 ENCOUNTER — Other Ambulatory Visit: Payer: Self-pay | Admitting: Cardiology

## 2023-10-26 VITALS — BP 132/77 | HR 51 | Ht 60.0 in | Wt 191.0 lb

## 2023-10-26 DIAGNOSIS — E785 Hyperlipidemia, unspecified: Secondary | ICD-10-CM

## 2023-10-26 DIAGNOSIS — J449 Chronic obstructive pulmonary disease, unspecified: Secondary | ICD-10-CM

## 2023-10-26 DIAGNOSIS — I739 Peripheral vascular disease, unspecified: Secondary | ICD-10-CM

## 2023-10-26 DIAGNOSIS — I10 Essential (primary) hypertension: Secondary | ICD-10-CM

## 2023-10-26 DIAGNOSIS — I25119 Atherosclerotic heart disease of native coronary artery with unspecified angina pectoris: Secondary | ICD-10-CM | POA: Diagnosis not present

## 2023-10-26 LAB — CBC WITH DIFFERENTIAL/PLATELET

## 2023-10-26 MED ORDER — ANORO ELLIPTA 62.5-25 MCG/ACT IN AEPB: 1.0000 | INHALATION_SPRAY | Freq: Every day | RESPIRATORY_TRACT | 6 refills | Status: DC

## 2023-10-26 MED ORDER — BISOPROLOL FUMARATE 5 MG PO TABS: 2.5000 mg | ORAL_TABLET | Freq: Every day | ORAL | 3 refills | Status: DC

## 2023-10-26 MED ORDER — ATORVASTATIN CALCIUM 80 MG PO TABS: 80.0000 mg | ORAL_TABLET | Freq: Every day | ORAL | 3 refills | Status: DC

## 2023-10-26 MED ORDER — ISOSORBIDE MONONITRATE ER 30 MG PO TB24: 30.0000 mg | ORAL_TABLET | Freq: Every day | ORAL | 3 refills | Status: DC

## 2023-10-26 MED ORDER — NITROGLYCERIN 0.4 MG SL SUBL: 0.4000 mg | SUBLINGUAL_TABLET | SUBLINGUAL | 6 refills | Status: DC | PRN

## 2023-10-26 MED ORDER — LOSARTAN POTASSIUM 25 MG PO TABS: 25.0000 mg | ORAL_TABLET | Freq: Every day | ORAL | 3 refills | Status: DC

## 2023-10-26 NOTE — Patient Instructions (Signed)
Medication Instructions:  Start Imdur 30 mg daily Continue all other medications *If you need a refill on your cardiac medications before your next appointment, please call your pharmacy*   Lab Work: Cmet,lipid panel,A1c,cbc to day   Testing/Procedures: None ordered   Follow-Up: At [facility], you and your health needs are our priority.  As part of our continuing mission to provide you with exceptional heart care, we have created designated Provider Care Teams.  These Care Teams include your primary Cardiologist (physician) and Advanced Practice Providers (APPs -  Physician Assistants and Nurse Practitioners) who all work together to provide you with the care you need, when you need it.  We recommend signing up for the patient portal called "MyChart".  Sign up information is provided on this After Visit Summary.  MyChart is used to connect with patients for Virtual Visits (Telemedicine).  Patients are able to view lab/test results, encounter notes, upcoming appointments, etc.  Non-urgent messages can be sent to your provider as well.   To learn more about what you can do with MyChart, go to [email or web address].    Your next appointment:  1 year   Call in June to schedule Dec appointment     Provider:  Dr.Jordan

## 2023-10-27 LAB — LIPID PANEL
Chol/HDL Ratio: 2.6 {ratio} (ref 0.0–4.4)
Cholesterol, Total: 119 mg/dL (ref 100–199)
HDL: 46 mg/dL (ref >39)
LDL Chol Calc (NIH): 59 mg/dL (ref 0–99)
Triglycerides: 64 mg/dL (ref 0–149)
VLDL Cholesterol Cal: 14 mg/dL (ref 5–40)

## 2023-10-27 LAB — CBC WITH DIFFERENTIAL/PLATELET
Basos: 1 %
EOS (ABSOLUTE): 0 10*3/uL (ref 0.0–0.2)
Eos: 1 %
Hematocrit: 42.7 % (ref 34.0–46.6)
Hemoglobin: 13.6 g/dL (ref 11.1–15.9)
Immature Granulocytes: 0 %
Immature Granulocytes: 0 10*3/uL (ref 0.0–0.1)
Lymphs: 27 %
MCH: 30.2 pg (ref 26.6–33.0)
MCHC: 31.9 g/dL (ref 31.5–35.7)
MCV: 95 fL (ref 79–97)
Monocytes Absolute: 0.1 10*3/uL (ref 0.0–0.4)
Monocytes Absolute: 0.6 10*3/uL (ref 0.1–0.9)
Monocytes: 9 %
Neutrophils Absolute: 1.9 10*3/uL (ref 0.7–3.1)
Neutrophils Absolute: 4.4 10*3/uL (ref 1.4–7.0)
Neutrophils: 62 %
Platelets: 251 10*3/uL (ref 150–450)
RBC: 4.51 x10E6/uL (ref 3.77–5.28)
RDW: 12.2 % (ref 11.7–15.4)
WBC: 7 10*3/uL (ref 3.4–10.8)

## 2023-10-27 LAB — COMPREHENSIVE METABOLIC PANEL
ALT: 26 [IU]/L (ref 0–32)
AST: 24 [IU]/L (ref 0–40)
Albumin: 3.8 g/dL — ABNORMAL LOW (ref 3.9–4.9)
Alkaline Phosphatase: 104 [IU]/L (ref 44–121)
BUN/Creatinine Ratio: 20 (ref 12–28)
BUN: 12 mg/dL (ref 8–27)
Bilirubin Total: 0.6 mg/dL (ref 0.0–1.2)
CO2: 23 mmol/L (ref 20–29)
Calcium: 8.8 mg/dL (ref 8.7–10.3)
Chloride: 107 mmol/L — ABNORMAL HIGH (ref 96–106)
Creatinine, Ser: 0.6 mg/dL (ref 0.57–1.00)
Globulin, Total: 3.5 g/dL (ref 1.5–4.5)
Glucose: 108 mg/dL — ABNORMAL HIGH (ref 70–99)
Potassium: 4.9 mmol/L (ref 3.5–5.2)
Sodium: 142 mmol/L (ref 134–144)
Total Protein: 7.3 g/dL (ref 6.0–8.5)
eGFR: 100 mL/min/{1.73_m2} (ref >59)

## 2023-10-27 LAB — HEMOGLOBIN A1C
Est. average glucose Bld gHb Est-mCnc: 146 mg/dL
Hgb A1c MFr Bld: 6.7 % — ABNORMAL HIGH (ref 4.8–5.6)

## 2023-12-25 ENCOUNTER — Other Ambulatory Visit: Payer: Self-pay | Admitting: Cardiology

## 2023-12-25 DIAGNOSIS — I5022 Chronic systolic (congestive) heart failure: Secondary | ICD-10-CM

## 2024-02-19 ENCOUNTER — Other Ambulatory Visit: Payer: Self-pay | Admitting: Cardiology

## 2024-02-21 ENCOUNTER — Other Ambulatory Visit: Payer: Self-pay | Admitting: Cardiology

## 2024-02-21 DIAGNOSIS — I4891 Unspecified atrial fibrillation: Secondary | ICD-10-CM

## 2024-04-11 ENCOUNTER — Other Ambulatory Visit: Payer: Self-pay | Admitting: Cardiology

## 2024-06-07 ENCOUNTER — Other Ambulatory Visit: Payer: Self-pay | Admitting: Cardiology

## 2024-06-09 NOTE — Telephone Encounter (Signed)
 For review

## 2024-06-17 ENCOUNTER — Other Ambulatory Visit: Payer: Self-pay | Admitting: Cardiology

## 2024-06-20 ENCOUNTER — Other Ambulatory Visit: Payer: Self-pay

## 2024-06-20 MED ORDER — BENZONATATE 100 MG PO CAPS: 100.0000 mg | ORAL_CAPSULE | Freq: Two times a day (BID) | ORAL | 1 refills | Status: AC

## 2024-09-15 ENCOUNTER — Other Ambulatory Visit: Payer: Self-pay | Admitting: Cardiology

## 2024-10-05 ENCOUNTER — Other Ambulatory Visit: Payer: Self-pay | Admitting: Cardiology

## 2024-10-09 ENCOUNTER — Other Ambulatory Visit: Payer: Self-pay | Admitting: Cardiology

## 2024-10-23 ENCOUNTER — Other Ambulatory Visit: Payer: Self-pay | Admitting: Cardiology

## 2024-11-14 NOTE — Progress Notes (Unsigned)
 "  Traci Choi Date of Birth: [birth year] Medical Record #[id number]  History of Present Illness: Traci Choi is seen today for followup CAD. She has a history of coronary disease, peripheral vascular disease, and COPD. She was admitted in August 2014 with an anterior STEMI. She had emergent stenting of the proximal LAD with DES. She had 70-80% disease in a small OM2 and PDA. Treated medically.  Ejection fraction was 35-40% at that time. Because of recurrent chest pain ECG changes she underwent repeat angiography during that hospitalization which showed continued stent patency. She did develop atrial fibrillation and was treated with amiodarone  but this has since been discontinued. Follow up Echo in Dec. 2014 showed EF 40-45%. In March 2017 she had a Myoview study that showed fixed defects in the mid apical anterior and apical inferior walls. No ischemia. EF 55%.   In May 2017 she went to the hospital in [place] with recurrent chest and throat pain.She ruled out for MI. LDL was 81. CXR was normal. Cardiac cath was performed showing a patent stent in the LAD. There was 40-50% stenosis in first diagonal, second OM and PDA. She was treated for GERD and had resolution of her chest pain.  She continues to note memory loss. We stopped her lipitor to see if this was a factor but she noted no change. Lipitor was resumed.   She was  admitted at [facility] on 07/17/2016 for chest pain. She had negative troponin values. Echo showing normal echo with EF of 55-65% and no wall motion abnormalities. No significant valve abnormalities. She underwent a NST which showed a large, severe partially reversible defect involving the mid-apical anterior, anteroseptal, apical inferior, apical septal, and apical walls, thought to be consistent with previous infarction and peri-infarct ischemia. Overall interrpreted as moderate-risk. Images were not available for review. She was seen back here in September 2017 and May 2018  and was doing well on medical therapy.   She was followed at [facility] for COPD in the past. PFTs in May 2018 showed worsening function and she was placed on Anoro. She has remote stenting of the distal abdominal aorta in the 1990s at [facility]. Dopplers in Jan 2022 showed patent stent and normal ABIs. Repeat here in January 2023 stable.   On follow up today she states that she is doing well. She states prior to the holidays she was doing very well. Over the holidays she did note more angina which she attributes to stress. Had to take 2 sl Ntg on one occasion. Chest pain has been much better this past week. She has still had difficulty finding a PCP close to home.   Current Outpatient Medications on File Prior to Visit  Medication Sig Dispense Refill   albuterol  (VENTOLIN  HFA) 108 (90 Base) MCG/ACT inhaler Inhale 2 puffs into the lungs every 6 (six) hours as needed for wheezing. 1 g 3   aspirin  81 MG chewable tablet Chew 1 tablet (81 mg total) by mouth daily.     benzonatate  (TESSALON ) 100 MG capsule Take 1 capsule (100 mg total) by mouth 2 (two) times daily. 30 capsule 1   fluticasone  (FLONASE ) 50 MCG/ACT nasal spray Place 2 sprays into both nostrils daily as needed for allergies. 18.2 g 3   No current facility-administered medications on file prior to visit.    Allergies  Allergen Reactions   Metronidazole Rash   Sulfa Antibiotics Hives   Codeine Other (See Comments)    Other Reaction: GI Upset  Penicillins Nausea And Vomiting and Rash    Past Medical History:  Diagnosis Date   Chronic systolic CHF (congestive heart failure) (HCC)    a. echo 07/08/13: EF 35-40%, inferior, septal and apical HK;  b. 07/2016 Echo ([place], [place]): EF 55-65%, no signifiicant valvular abnormalities.   COPD (chronic obstructive pulmonary disease) (HCC)    followed at [facility]   Coronary artery disease    a. Anterior STEMI (8/14):  LHC - pLAD occluded (PCI: Promus premier 3x20 mm DES to), EF 35-40%;  b. Re-look cath  (9/14):  PLAD stent ok, D2 50-70 (small)->med Rx c. cath 03/2016 patent pLAD stent, 50% D1, 50% OM2->Med Rx;  d. 07/2016 MV ([place]): large ant/apical defect w/ peri-inf ischemia->Med rx.   GERD (gastroesophageal reflux disease)    Hyperlipidemia    Ischemic cardiomyopathy    a. 07/08/13 Echo: EF 35-40%, inferior, septal and apical HK;  b. 07/2016 Echo ([place], [place]): EF 55-65%.   PAD (peripheral artery disease)    a. 01/2014 ABI's ([facility]): normal.   PAF (paroxysmal atrial fibrillation) (HCC)    a. 06/2013 post MI => Amiodarone  x 1 month-->not on long term OAC (CHA2DS2VASc = 3).    Past Surgical History:  Procedure Laterality Date   ABDOMINAL AORTA STENT     LEFT HEART CATHETERIZATION WITH CORONARY ANGIOGRAM Bilateral 07/08/2013   Procedure: LEFT HEART CATHETERIZATION WITH CORONARY ANGIOGRAM;  Surgeon: Montana Fassnacht M Nioma Mccubbins, MD;  Location: [facility] CATH LAB;  Service: Cardiovascular;  Laterality: Bilateral;   LEFT HEART CATHETERIZATION WITH CORONARY ANGIOGRAM N/A 07/12/2013   Procedure: LEFT HEART CATHETERIZATION WITH CORONARY ANGIOGRAM;  Surgeon: Jacey Eckerson M Abbee Cremeens, MD;  Location: [facility] CATH LAB;  Service: Cardiovascular;  Laterality: N/A;   PERCUTANEOUS STENT INTERVENTION  07/08/2013   Procedure: PERCUTANEOUS STENT INTERVENTION;  Surgeon: Andjela Wickes M Aniya Jolicoeur, MD;  Location: [facility] CATH LAB;  Service: Cardiovascular;;  Prox LAD DES    Social History   Tobacco Use  Smoking Status Never  Smokeless Tobacco Former    Social History   Substance and Sexual Activity  Alcohol Use No    Family History  Problem Relation Age of Onset   Heart attack Mother     Review of Systems: As noted in history of present illness.  All other systems were reviewed and are negative.  Physical Exam: BP 130/80 (BP Location: Left Arm, Patient Position: Sitting, Cuff Size: Normal)   Pulse (!) 58   Ht 5' (1.524 m)   Wt 193 lb (87.5 kg)   BMI 37.69 kg/m  GENERAL:  Well appearing BF in NAD HEENT:  PERRL, EOMI, sclera are clear.  Oropharynx is clear. NECK:  No jugular venous distention, carotid upstroke brisk and symmetric, no bruits, no thyromegaly or adenopathy LUNGS:  Clear to auscultation bilaterally CHEST:  Unremarkable HEART:  RRR,  PMI not displaced or sustained,S1 and S2 within normal limits, no S3, no S4: no clicks, no rubs, no murmurs ABD:  Soft, nontender. BS +, no masses or bruits. No hepatomegaly, no splenomegaly EXT:  2 + pulses throughout, no edema, no cyanosis no clubbing SKIN:  Warm and dry.  No rashes NEURO:  Alert and oriented x 3. Cranial nerves II through XII intact. PSYCH:  Cognitively intact   LABORATORY DATA: Lab Results  Component Value Date   WBC 7.0 10/26/2023   HGB 13.6 10/26/2023   HCT 42.7 10/26/2023   PLT 251 10/26/2023   GLUCOSE 108 (H) 10/26/2023   CHOL 119 10/26/2023   TRIG 64 10/26/2023  HDL 46 10/26/2023   LDLCALC 59 10/26/2023   ALT 26 10/26/2023   AST 24 10/26/2023   NA 142 10/26/2023   K 4.9 10/26/2023   CL 107 (H) 10/26/2023   CREATININE 0.60 10/26/2023   BUN 12 10/26/2023   CO2 23 10/26/2023   TSH 1.451 07/08/2013   INR 1.00 07/08/2013   HGBA1C 6.7 (H) 10/26/2023    EKG Interpretation Date/Time:  Wednesday November 16 2024 10:15:26 EST Ventricular Rate:  58 PR Interval:  164 QRS Duration:  68 QT Interval:  406 QTC Calculation: 398 R Axis:   1  Text Interpretation: Sinus bradycardia Low voltage QRS Septal infarct (cited on or before 08-Jul-2013) When compared with ECG of 26-Oct-2023 09:33, Nonspecific T wave abnormality no longer evident in Inferior leads Nonspecific T wave abnormality no longer evident in Lateral leads Confirmed by Krisi Azua [phone] on 11/16/2024 10:17:47 AM  EKG Interpretation Date/Time:  Wednesday November 16 2024 10:15:26 EST Ventricular Rate:  58 PR Interval:  164 QRS Duration:  68 QT Interval:  406 QTC Calculation: 398 R Axis:   1  Text Interpretation: Sinus bradycardia Low voltage QRS Septal infarct (cited on or before  08-Jul-2013) When compared with ECG of 26-Oct-2023 09:33, Nonspecific T wave abnormality no longer evident in Inferior leads Nonspecific T wave abnormality no longer evident in Lateral leads Confirmed by Anaika Santillano [phone]) on 11/16/2024 10:17:47 AM     Assessment / Plan: 1. Coronary disease status post anterior STEMI in 2014. Status post stenting of the proximal LAD with drug-eluting stent in 2014.    Myoview study in March 2017showed no ischemia with some scar. EF 55%. Cardiac cath in May 2017 in [facility] showed no obstructive disease. Repeat Myoview there in September 2017 showed mostly scar. She does have intermittent angina but has overall done well. Will continue imdur  30 mg daily and bisoprolol . Continue other medical therapy with prn Ntg. Focus on lifestyle modification with heart healthy diet, weight loss, and exercise. Ecg today is stable.   2. Ischemic cardiomyopathy. EF improved to 55% by Myoview. Also normal by last Cath. Stressed importance of sodium restriction.  Continue losartan  and bisoprolol . She appears euvolemic today.  3. Atrial fibrillation at time of initial MI. No recurrence.   4.  Memory loss. Seen previously by Neuro. No change.  6. PAD. Status post distal abdominal aortic stenting in the past. Last dopplers in Jan 2023 showed patent stents and normal ABIs.  7. Hypercholesterolemia.  on lipitor and Zetia . Last LDL 71 in Dec 2024 was 59. Will update labs today  8. COPD- stable on Anoro with albuterol  prn. refilled  9. Pre diabetes. Last A1c 6.7%. will update may need to consider additional therapy if high.    I will follow up in one year. Still encouraged her to get established with primary care physician.  "

## 2024-11-16 ENCOUNTER — Ambulatory Visit: Admitting: Cardiology

## 2024-11-16 VITALS — BP 130/80 | HR 58 | Ht 60.0 in | Wt 193.0 lb

## 2024-11-16 DIAGNOSIS — E785 Hyperlipidemia, unspecified: Secondary | ICD-10-CM

## 2024-11-16 DIAGNOSIS — I5022 Chronic systolic (congestive) heart failure: Secondary | ICD-10-CM

## 2024-11-16 DIAGNOSIS — I4891 Unspecified atrial fibrillation: Secondary | ICD-10-CM

## 2024-11-16 DIAGNOSIS — I25119 Atherosclerotic heart disease of native coronary artery with unspecified angina pectoris: Secondary | ICD-10-CM | POA: Diagnosis not present

## 2024-11-16 DIAGNOSIS — I10 Essential (primary) hypertension: Secondary | ICD-10-CM | POA: Diagnosis not present

## 2024-11-16 DIAGNOSIS — I739 Peripheral vascular disease, unspecified: Secondary | ICD-10-CM | POA: Diagnosis not present

## 2024-11-16 LAB — LIPID PANEL

## 2024-11-16 MED ORDER — ATORVASTATIN CALCIUM 80 MG PO TABS: 80.0000 mg | ORAL_TABLET | Freq: Every day | ORAL | 3 refills | Status: AC

## 2024-11-16 MED ORDER — NITROGLYCERIN 0.4 MG SL SUBL: 0.4000 mg | SUBLINGUAL_TABLET | SUBLINGUAL | 6 refills | Status: AC | PRN

## 2024-11-16 MED ORDER — LOSARTAN POTASSIUM 25 MG PO TABS: 25.0000 mg | ORAL_TABLET | Freq: Every day | ORAL | 3 refills | Status: AC

## 2024-11-16 MED ORDER — ISOSORBIDE MONONITRATE ER 30 MG PO TB24: 30.0000 mg | ORAL_TABLET | Freq: Every day | ORAL | 3 refills | Status: AC

## 2024-11-16 MED ORDER — PANTOPRAZOLE SODIUM 40 MG PO TBEC: 40.0000 mg | DELAYED_RELEASE_TABLET | Freq: Every day | ORAL | 3 refills | Status: AC

## 2024-11-16 MED ORDER — BISOPROLOL FUMARATE 5 MG PO TABS: 2.5000 mg | ORAL_TABLET | Freq: Every day | ORAL | 3 refills | Status: AC

## 2024-11-16 MED ORDER — FUROSEMIDE 20 MG PO TABS: 20.0000 mg | ORAL_TABLET | Freq: Every day | ORAL | 3 refills | Status: AC

## 2024-11-16 MED ORDER — UMECLIDINIUM-VILANTEROL 62.5-25 MCG/ACT IN AEPB: 1.0000 | INHALATION_SPRAY | Freq: Every day | RESPIRATORY_TRACT | 6 refills | Status: AC

## 2024-11-16 MED ORDER — POTASSIUM CHLORIDE ER 10 MEQ PO TBCR: 10.0000 meq | EXTENDED_RELEASE_TABLET | Freq: Every day | ORAL | 3 refills | Status: AC

## 2024-11-16 MED ORDER — EZETIMIBE 10 MG PO TABS: 10.0000 mg | ORAL_TABLET | Freq: Every day | ORAL | 3 refills | Status: AC

## 2024-11-16 NOTE — Patient Instructions (Addendum)
 Medication Instructions:  Continue all medications *If you need a refill on your cardiac medications before your next appointment, please call your pharmacy*  Lab Work: Cbc,cmet,lipid panel,A1c today  Testing/Procedures: None ordered  Follow-Up: At [facility], you and your health needs are our priority.  As part of our continuing mission to provide you with exceptional heart care, our providers are all part of one team.  This team includes your primary Cardiologist (physician) and Advanced Practice Providers or APPs (Physician Assistants and Nurse Practitioners) who all work together to provide you with the care you need, when you need it.  Your next appointment:  1 year    Call in Oct to schedule Jan appointment     Provider:  Dr.Jordan    We recommend signing up for the patient portal called MyChart.  Sign up information is provided on this After Visit Summary.  MyChart is used to connect with patients for Virtual Visits (Telemedicine).  Patients are able to view lab/test results, encounter notes, upcoming appointments, etc.  Non-urgent messages can be sent to your provider as well.   To learn more about what you can do with MyChart, go to [email or web address].

## 2024-11-17 ENCOUNTER — Ambulatory Visit: Payer: Self-pay | Admitting: Cardiology

## 2024-11-17 LAB — COMPREHENSIVE METABOLIC PANEL WITH GFR
ALT: 26 IU/L (ref 0–32)
AST: 22 IU/L (ref 0–40)
Albumin: 3.8 g/dL — ABNORMAL LOW (ref 3.9–4.9)
Alkaline Phosphatase: 97 IU/L (ref 49–135)
BUN/Creatinine Ratio: 20 (ref 12–28)
BUN: 13 mg/dL (ref 8–27)
Bilirubin Total: 0.4 mg/dL (ref 0.0–1.2)
CO2: 24 mmol/L (ref 20–29)
Calcium: 8.9 mg/dL (ref 8.7–10.3)
Chloride: 107 mmol/L — ABNORMAL HIGH (ref 96–106)
Creatinine, Ser: 0.64 mg/dL (ref 0.57–1.00)
Globulin, Total: 3.2 g/dL (ref 1.5–4.5)
Glucose: 107 mg/dL — ABNORMAL HIGH (ref 70–99)
Potassium: 4.6 mmol/L (ref 3.5–5.2)
Sodium: 143 mmol/L (ref 134–144)
Total Protein: 7 g/dL (ref 6.0–8.5)
eGFR: 98 mL/min/1.73

## 2024-11-17 LAB — LIPID PANEL
Chol/HDL Ratio: 3 ratio (ref 0.0–4.4)
Cholesterol, Total: 134 mg/dL (ref 100–199)
HDL: 45 mg/dL
LDL Chol Calc (NIH): 76 mg/dL (ref 0–99)
Triglycerides: 65 mg/dL (ref 0–149)
VLDL Cholesterol Cal: 13 mg/dL (ref 5–40)

## 2024-11-17 LAB — CBC WITH DIFFERENTIAL/PLATELET
Basophils Absolute: 0.1 x10E3/uL (ref 0.0–0.2)
Basos: 1 %
EOS (ABSOLUTE): 0.1 x10E3/uL (ref 0.0–0.4)
Eos: 1 %
Hematocrit: 43.8 % (ref 34.0–46.6)
Hemoglobin: 13.9 g/dL (ref 11.1–15.9)
Immature Grans (Abs): 0 x10E3/uL (ref 0.0–0.1)
Immature Granulocytes: 0 %
Lymphocytes Absolute: 1.9 x10E3/uL (ref 0.7–3.1)
Lymphs: 28 %
MCH: 30.5 pg (ref 26.6–33.0)
MCHC: 31.7 g/dL (ref 31.5–35.7)
MCV: 96 fL (ref 79–97)
Monocytes Absolute: 0.7 x10E3/uL (ref 0.1–0.9)
Monocytes: 9 %
Neutrophils Absolute: 4.3 x10E3/uL (ref 1.4–7.0)
Neutrophils: 61 %
Platelets: 234 x10E3/uL (ref 150–450)
RBC: 4.56 x10E6/uL (ref 3.77–5.28)
RDW: 12.6 % (ref 11.7–15.4)
WBC: 7 x10E3/uL (ref 3.4–10.8)

## 2024-11-17 LAB — HEMOGLOBIN A1C
Est. average glucose Bld gHb Est-mCnc: 143 mg/dL
Hgb A1c MFr Bld: 6.6 % — ABNORMAL HIGH (ref 4.8–5.6)
